# Patient Record
Sex: Female | Born: 1940 | Race: Black or African American | Hispanic: No | Smoking: Current every day smoker
Health system: Southern US, Community
[De-identification: ages and names within clinical notes are randomized; demographics above are authoritative.]

## PROBLEM LIST (undated history)

## (undated) DIAGNOSIS — I10 Essential (primary) hypertension: Secondary | ICD-10-CM

## (undated) DIAGNOSIS — H9190 Unspecified hearing loss, unspecified ear: Secondary | ICD-10-CM

## (undated) DIAGNOSIS — E039 Hypothyroidism, unspecified: Secondary | ICD-10-CM

## (undated) DIAGNOSIS — E079 Disorder of thyroid, unspecified: Secondary | ICD-10-CM

## (undated) DIAGNOSIS — K219 Gastro-esophageal reflux disease without esophagitis: Secondary | ICD-10-CM

## (undated) DIAGNOSIS — R609 Edema, unspecified: Secondary | ICD-10-CM

## (undated) HISTORY — PX: ABDOMINAL HYSTERECTOMY: SHX81

## (undated) HISTORY — PX: BACK SURGERY: SHX140

## (undated) HISTORY — PX: OTHER SURGICAL HISTORY: SHX169

---

## 2005-12-02 ENCOUNTER — Ambulatory Visit: Payer: Self-pay | Admitting: Family Medicine

## 2006-01-18 ENCOUNTER — Ambulatory Visit: Payer: Self-pay | Admitting: Unknown Physician Specialty

## 2006-04-01 ENCOUNTER — Ambulatory Visit: Payer: Self-pay | Admitting: Unknown Physician Specialty

## 2007-06-21 ENCOUNTER — Emergency Department: Payer: Self-pay | Admitting: Emergency Medicine

## 2007-06-21 ENCOUNTER — Other Ambulatory Visit: Payer: Self-pay

## 2009-10-28 ENCOUNTER — Ambulatory Visit: Payer: Self-pay

## 2009-11-17 ENCOUNTER — Ambulatory Visit: Payer: Self-pay | Admitting: Pain Medicine

## 2013-05-04 DIAGNOSIS — K219 Gastro-esophageal reflux disease without esophagitis: Secondary | ICD-10-CM | POA: Insufficient documentation

## 2015-12-29 ENCOUNTER — Emergency Department
Admission: EM | Admit: 2015-12-29 | Discharge: 2015-12-29 | Disposition: A | Discharge status: Home / Self Care | Payer: Medicare Other | Attending: Emergency Medicine | Admitting: Emergency Medicine

## 2015-12-29 ENCOUNTER — Encounter: Payer: Self-pay | Admitting: *Deleted

## 2015-12-29 DIAGNOSIS — Y9301 Activity, walking, marching and hiking: Secondary | ICD-10-CM | POA: Diagnosis not present

## 2015-12-29 DIAGNOSIS — F17200 Nicotine dependence, unspecified, uncomplicated: Secondary | ICD-10-CM | POA: Insufficient documentation

## 2015-12-29 DIAGNOSIS — I10 Essential (primary) hypertension: Secondary | ICD-10-CM | POA: Insufficient documentation

## 2015-12-29 DIAGNOSIS — X58XXXA Exposure to other specified factors, initial encounter: Secondary | ICD-10-CM | POA: Diagnosis not present

## 2015-12-29 DIAGNOSIS — Y998 Other external cause status: Secondary | ICD-10-CM | POA: Diagnosis not present

## 2015-12-29 DIAGNOSIS — S8991XA Unspecified injury of right lower leg, initial encounter: Secondary | ICD-10-CM | POA: Diagnosis present

## 2015-12-29 DIAGNOSIS — Y92000 Kitchen of unspecified non-institutional (private) residence as  the place of occurrence of the external cause: Secondary | ICD-10-CM | POA: Diagnosis not present

## 2015-12-29 DIAGNOSIS — S83241A Other tear of medial meniscus, current injury, right knee, initial encounter: Secondary | ICD-10-CM | POA: Diagnosis not present

## 2015-12-29 HISTORY — DX: Disorder of thyroid, unspecified: E07.9

## 2015-12-29 HISTORY — DX: Essential (primary) hypertension: I10

## 2015-12-29 MED ORDER — MELOXICAM 15 MG PO TABS: 15.0000 mg | ORAL_TABLET | Freq: Every day | ORAL | Status: DC

## 2015-12-29 MED ORDER — HYDROCODONE-ACETAMINOPHEN 5-325 MG PO TABS: 1.0000 | ORAL_TABLET | ORAL | Status: DC | PRN

## 2015-12-29 MED ORDER — KETOROLAC TROMETHAMINE 60 MG/2ML IM SOLN
60.0000 mg | Freq: Once | INTRAMUSCULAR | Status: AC
  Administered 2015-12-29: 60 mg via INTRAMUSCULAR
  Filled 2015-12-29: qty 2

## 2015-12-29 NOTE — Discharge Instructions (Signed)
How to Use a Knee Brace °A knee brace is a device that you wear to support your knee, especially if the knee is healing after an injury or surgery. There are several types of knee braces. Some are designed to prevent an injury (prophylactic brace). These are often worn during sports. Others support an injured knee (functional brace) or keep it still while it heals (rehabilitative brace). People with severe arthritis of the knee may benefit from a brace that takes some pressure off the knee (unloader brace). Most knee braces are made from a combination of cloth and metal or plastic.  °You may need to wear a knee brace to: °· Relieve knee pain. °· Help your knee support your weight (improve stability). °· Help you walk farther (improve mobility). °· Prevent injury. °· Support your knee while it heals from surgery or from an injury. °RISKS AND COMPLICATIONS °Generally, knee braces are very safe to wear. However, problems may occur, including: °· Skin irritation that may lead to infection. °· Making your condition worse if you wear the brace in the wrong way. °HOW TO USE A KNEE BRACE °Different braces will have different instructions for use. Your health care provider will tell you or show you: °· How to put on your brace. °· How to adjust the brace. °· When and how often to wear the brace. °· How to remove the brace. °· If you will need any assistive devices in addition to the brace, such as crutches or a cane. °In general, your brace should: °· Have the hinge of the brace line up with the bend of your knee. °· Have straps, hooks, or tapes that fasten snugly around your leg. °· Not feel too tight or too loose.  °HOW TO CARE FOR A KNEE BRACE °· Check your brace often for signs of damage, such as loose connections or attachments. Your knee brace may get damaged or wear out during normal use. °· Wash the fabric parts of your brace with soap and water. °· Read the insert that comes with your brace for other specific care  instructions.  °SEEK MEDICAL CARE IF: °· Your knee brace is too loose or too tight and you cannot adjust it. °· Your knee brace causes skin redness, swelling, bruising, or irritation. °· Your knee brace is not helping. °· Your knee brace is making your knee pain worse. °  °This information is not intended to replace advice given to you by your health care provider. Make sure you discuss any questions you have with your health care provider. °  °Document Released: 02/19/2004 Document Revised: 08/20/2015 Document Reviewed: 03/24/2015 °Elsevier Interactive Patient Education ©2016 [employer] ° °Meniscus Tear °A meniscus tear is a knee injury in which a piece of the meniscus is torn. The meniscus is a thick, rubbery, wedge-shaped cartilage in the knee. Two menisci are located in each knee. They sit between the upper bone (femur) and lower bone (tibia) that make up the knee joint. Each meniscus acts as a shock absorber for the knee. °A torn meniscus is one of the most common types of knee injuries. This injury can range from mild to severe. Surgery may be needed for a severe tear. °CAUSES °This injury may be caused by any squatting, twisting, or pivoting movement. Sports-related injuries are the most common cause. These often occur from: °· Running and stopping suddenly. °· Changing direction. °· Being tackled or knocked off your feet. °As people get older, their meniscus gets thinner and weaker. In these   people, tears can happen more easily, such as from climbing stairs.  °RISK FACTORS °This injury is more likely to happen to: °· People who play contact sports. °· Males. °· People who are 30-40 years of age. °SYMPTOMS  °Symptoms of this injury include: °· Knee pain, especially at the side of the knee joint. You may feel pain when the injury occurs, or you may only hear a pop and feel pain later. °· A feeling that your knee is clicking, catching, locking, or giving way. °· Not being able to fully bend or extend your  knee. °· Bruising or swelling in your knee. °DIAGNOSIS  °This injury may be diagnosed based on your symptoms and a physical exam. The physical exam may include: °· Moving your knee in different ways. °· Feeling for tenderness. °· Listening for a clicking sound. °· Checking if your knee locks or catches. °You may also have tests, such as: °· X-rays. °· MRI. °· A procedure to look inside your knee with a narrow surgical telescope (arthroscopy). °You may be referred to a knee specialist (orthopedic surgeon). °TREATMENT  °Treatment for this injury depends on the severity of the tear. Treatment for a mild tear may include: °· Rest. °· Medicine to reduce pain and swelling. This is usually a nonsteroidal anti-inflammatory drug (NSAID). °· A knee brace or an elastic sleeve or wrap. °· Using crutches or a walker to keep weight off your knee and to help you walk. °· Exercises to strengthen your knee (physical therapy). °You may need surgery if you have a severe tear or if other treatments are not working.  °HOME CARE INSTRUCTIONS °Managing Pain and Swelling °· Take over-the-counter and prescription medicines only as told by your health care provider. °· If directed, apply ice to the injured area: °¨ Put ice in a plastic bag. °¨ Place a towel between your skin and the bag. °¨ Leave the ice on for 20 minutes, 2-3 times per day. °· Raise (elevate) the injured area above the level of your heart while you are sitting or lying down. °Activity °· Do not use the injured limb to support your body weight until your health care provider says that you can. Use crutches or a walker as told by your health care provider. °· Return to your normal activities as told by your health care provider. Ask your health care provider what activities are safe for you. °· Perform range-of-motion exercises only as told by your health care provider. °· Begin doing exercises to strengthen your knee and leg muscles only as told by your health care provider.  After you recover, your health care provider may recommend these exercises to help prevent another injury. °General Instructions °· Use a knee brace or elastic wrap as told by your health care provider. °· Keep all follow-up visits as told by your health care provider. This is important. °SEEK MEDICAL CARE IF: °· You have a fever. °· Your knee becomes red, tender, or swollen. °· Your pain medicine is not helping. °· Your symptoms get worse or do not improve after 2 weeks of home care. °  °This information is not intended to replace advice given to you by your health care provider. Make sure you discuss any questions you have with your health care provider. °  °Document Released: 02/19/2003 Document Revised: 08/20/2015 Document Reviewed: 03/24/2015 °Elsevier Interactive Patient Education ©2016 Elsevier Inc. ° °

## 2015-12-29 NOTE — ED Notes (Signed)
Pain right knee staretd 2 weeks ago , today right knee popped

## 2015-12-29 NOTE — ED Provider Notes (Signed)
[facility] Emergency Department Provider Note  ____________________________________________  Time seen: Approximately 3:44 PM  I have reviewed the triage vital signs and the nursing notes.   HISTORY  Chief Complaint Knee Pain    HPI Patricia Nguyen is a 75 y.o. female who presents to the emergency department complaining of right knee pain. She states that she felt a "full sensation" to her right knee that started approximately 2 weeks ago. Last several wee and then resolved. She's been symptom-free for a week. This morning she states she was walking across her kitchen when she felt a "catching sensation" in her right knee. She states when she straightened it out there is a sharp pain to the medial aspect of her right knee. She endorses sharp pain to this area that has been constant. She denies any recent injury to knee. She denies any trauma to knee today. Patient states all pain is in the medial joint line.   Past Medical History  Diagnosis Date  . Thyroid disease   . Hypertension     There are no active problems to display for this patient.   History reviewed. No pertinent past surgical history.  Current Outpatient Rx  Name  Route  Sig  Dispense  Refill  . HYDROcodone-acetaminophen (NORCO/VICODIN) 5-325 MG tablet   Oral   Take 1 tablet by mouth every 4 (four) hours as needed for moderate pain.   20 tablet   0   . meloxicam (MOBIC) 15 MG tablet   Oral   Take 1 tablet (15 mg total) by mouth daily.   30 tablet   0     Allergies Review of patient's allergies indicates no known allergies.  No family history on file.  Social History Social History  Substance Use Topics  . Smoking status: Current Every Day Smoker  . Smokeless tobacco: None  . Alcohol Use: No     Review of Systems  Constitutional: No fever/chills Eyes: No visual changes. No discharge ENT: No sore throat. Cardiovascular: no chest pain. Respiratory: no cough. No  SOB. Gastrointestinal: No abdominal pain.  No nausea, no vomiting.  No diarrhea.  No constipation. Genitourinary: Negative for dysuria. No hematuria Musculoskeletal: Negative for back pain. positive for right knee pain. Skin: Negative for rash. Neurological: Negative for headaches, focal weakness or numbness. 10-point ROS otherwise negative.  ____________________________________________   PHYSICAL EXAM:  VITAL SIGNS: ED Triage Vitals  Enc Vitals Group     BP 12/29/15 1343 153/70 mmHg     Pulse Rate 12/29/15 1343 78     Resp 12/29/15 1343 20     Temp 12/29/15 1343 98.1 F (36.7 C)     Temp Source 12/29/15 1343 Oral     SpO2 12/29/15 1343 99 %     Weight 12/29/15 1343 133 lb (60.328 kg)     Height 12/29/15 1343 5\' 6"  (1.676 m)     Head Cir --      Peak Flow --      Pain Score 12/29/15 1344 10     Pain Loc --      Pain Edu? --      Excl. in GC? --      Constitutional: Alert and oriented. Well appearing and in no acute distress. Eyes: Conjunctivae are normal. PERRL. EOMI. Head: Atraumatic. ENT:      Ears:       Nose: No congestion/rhinnorhea.      Mouth/Throat: Mucous membranes are moist.  Neck: No stridor.  Hematological/Lymphatic/Immunilogical: No cervical lymphadenopathy. Cardiovascular: Normal rate, regular rhythm. Normal S1 and S2.  Good peripheral circulation. Respiratory: Normal respiratory effort without tachypnea or retractions. Lungs CTAB. Gastrointestinal: Soft and nontender. No distention. No CVA tenderness. Musculoskeletal: No lower extremity tenderness nor edema.  No joint effusions. or minor edema noted to right knee when compared to left. Limited range of motion due to pain. Patient endorses tenderness to palpation along the medial joint line. Varus, valgus, and Lachman's are negative. McMurray's is positive daily. Neurologic:  Normal speech and language. No gross focal neurologic deficits are appreciated.  Skin:  Skin is warm, dry and intact. No rash  noted. Psychiatric: Mood and affect are normal. Speech and behavior are normal. Patient exhibits appropriate insight and judgement.   ____________________________________________   LABS (all labs ordered are listed, but only abnormal results are displayed)  Labs Reviewed - No data to display ____________________________________________  EKG   ____________________________________________  RADIOLOGY   No results found.  ____________________________________________    PROCEDURES  Procedure(s) performed:       Medications  ketorolac (TORADOL) injection 60 mg (60 mg Intramuscular Given 12/29/15 1604)     ____________________________________________   INITIAL IMPRESSION / ASSESSMENT AND PLAN / ED COURSE  Pertinent labs & imaging results that were available during my care of the patient were reviewed by me and considered in my medical decision making (see chart for details).  Patient's diagnosis is consistent with  medial meniscal tear to the right knee. Patient has a positive McMurray's test and based off of this with patient's history meniscal tear is most probable cause. Patient has no history of trauma or injury so x-ray is not ordered at this time.. Patient will be discharged home with prescriptions for enteric laboratories and pain medication. Knee is immobilized prior to discharge.. Patient is to follow up with orthopedic surgeon. Patient is given ED precautions to return to the ED for any worsening or new symptoms.     ____________________________________________  FINAL CLINICAL IMPRESSION(S) / ED DIAGNOSES  Final diagnoses:  Acute medial meniscal tear, right, initial encounter      NEW MEDICATIONS STARTED DURING THIS VISIT:  New Prescriptions   HYDROCODONE-ACETAMINOPHEN (NORCO/VICODIN) 5-325 MG TABLET    Take 1 tablet by mouth every 4 (four) hours as needed for moderate pain.   MELOXICAM (MOBIC) 15 MG TABLET    Take 1 tablet (15 mg total) by mouth  daily.       Delorise RoyalsJonathan D Bonnee Zertuche, PA-C 12/29/15 1628  Jennye MoccasinBrian S Quigley, MD 12/29/15 [phone]

## 2016-01-05 DIAGNOSIS — E119 Type 2 diabetes mellitus without complications: Secondary | ICD-10-CM | POA: Insufficient documentation

## 2016-04-05 DIAGNOSIS — R195 Other fecal abnormalities: Secondary | ICD-10-CM | POA: Insufficient documentation

## 2016-04-12 ENCOUNTER — Encounter: Payer: Self-pay | Admitting: *Deleted

## 2016-04-13 ENCOUNTER — Encounter
Admission: RE | Disposition: A | Discharge status: Home / Self Care | Payer: Self-pay | Source: Ambulatory Visit | Attending: Ophthalmology

## 2016-04-13 ENCOUNTER — Ambulatory Visit: Payer: Medicare Other | Admitting: Anesthesiology

## 2016-04-13 ENCOUNTER — Encounter: Payer: Self-pay | Admitting: *Deleted

## 2016-04-13 ENCOUNTER — Ambulatory Visit
Admission: RE | Admit: 2016-04-13 | Discharge: 2016-04-13 | Disposition: A | Discharge status: Home / Self Care | Payer: Medicare Other | Source: Ambulatory Visit | Attending: Ophthalmology | Admitting: Ophthalmology

## 2016-04-13 DIAGNOSIS — E079 Disorder of thyroid, unspecified: Secondary | ICD-10-CM | POA: Insufficient documentation

## 2016-04-13 DIAGNOSIS — F17200 Nicotine dependence, unspecified, uncomplicated: Secondary | ICD-10-CM | POA: Insufficient documentation

## 2016-04-13 DIAGNOSIS — E7800 Pure hypercholesterolemia, unspecified: Secondary | ICD-10-CM | POA: Insufficient documentation

## 2016-04-13 DIAGNOSIS — I10 Essential (primary) hypertension: Secondary | ICD-10-CM | POA: Diagnosis not present

## 2016-04-13 DIAGNOSIS — Z90710 Acquired absence of both cervix and uterus: Secondary | ICD-10-CM | POA: Insufficient documentation

## 2016-04-13 DIAGNOSIS — H9190 Unspecified hearing loss, unspecified ear: Secondary | ICD-10-CM | POA: Insufficient documentation

## 2016-04-13 DIAGNOSIS — J449 Chronic obstructive pulmonary disease, unspecified: Secondary | ICD-10-CM | POA: Diagnosis not present

## 2016-04-13 DIAGNOSIS — K219 Gastro-esophageal reflux disease without esophagitis: Secondary | ICD-10-CM | POA: Diagnosis not present

## 2016-04-13 DIAGNOSIS — H2512 Age-related nuclear cataract, left eye: Secondary | ICD-10-CM | POA: Insufficient documentation

## 2016-04-13 HISTORY — PX: CATARACT EXTRACTION W/PHACO: SHX586

## 2016-04-13 HISTORY — DX: Gastro-esophageal reflux disease without esophagitis: K21.9

## 2016-04-13 HISTORY — DX: Unspecified hearing loss, unspecified ear: H91.90

## 2016-04-13 SURGERY — PHACOEMULSIFICATION, CATARACT, WITH IOL INSERTION
Anesthesia: Monitor Anesthesia Care | Site: Eye | Laterality: Left | Wound class: Clean

## 2016-04-13 MED ORDER — CARBACHOL 0.01 % IO SOLN
INTRAOCULAR | Status: DC | PRN
  Administered 2016-04-13: 0.5 mL via INTRAOCULAR

## 2016-04-13 MED ORDER — ARMC OPHTHALMIC DILATING GEL
1.0000 "application " | OPHTHALMIC | Status: DC | PRN
  Administered 2016-04-13: 1 via OPHTHALMIC

## 2016-04-13 MED ORDER — MOXIFLOXACIN HCL 0.5 % OP SOLN: 1.0000 [drp] | OPHTHALMIC | Status: DC | PRN

## 2016-04-13 MED ORDER — POVIDONE-IODINE 5 % OP SOLN
1.0000 "application " | Freq: Once | OPHTHALMIC | Status: AC
  Administered 2016-04-13: 1 via OPHTHALMIC

## 2016-04-13 MED ORDER — FENTANYL CITRATE (PF) 100 MCG/2ML IJ SOLN
INTRAMUSCULAR | Status: DC | PRN
  Administered 2016-04-13: 50 ug via INTRAVENOUS

## 2016-04-13 MED ORDER — ARMC OPHTHALMIC DILATING GEL
OPHTHALMIC | Status: AC
  Filled 2016-04-13: qty 0.25

## 2016-04-13 MED ORDER — TETRACAINE HCL 0.5 % OP SOLN
OPHTHALMIC | Status: AC
  Filled 2016-04-13: qty 2

## 2016-04-13 MED ORDER — POVIDONE-IODINE 5 % OP SOLN
OPHTHALMIC | Status: AC
  Filled 2016-04-13: qty 30

## 2016-04-13 MED ORDER — NA CHONDROIT SULF-NA HYALURON 40-17 MG/ML IO SOLN
INTRAOCULAR | Status: DC | PRN
  Administered 2016-04-13: 1 mL via INTRAOCULAR

## 2016-04-13 MED ORDER — SODIUM CHLORIDE 0.9 % IV SOLN
INTRAVENOUS | Status: DC
  Administered 2016-04-13 (×2): via INTRAVENOUS

## 2016-04-13 MED ORDER — NA CHONDROIT SULF-NA HYALURON 40-17 MG/ML IO SOLN
INTRAOCULAR | Status: AC
  Filled 2016-04-13: qty 1

## 2016-04-13 MED ORDER — EPINEPHRINE HCL 1 MG/ML IJ SOLN
INTRAOCULAR | Status: DC | PRN
  Administered 2016-04-13: 11:00:00 via OPHTHALMIC

## 2016-04-13 MED ORDER — CEFUROXIME OPHTHALMIC INJECTION 1 MG/0.1 ML
INJECTION | OPHTHALMIC | Status: AC
  Filled 2016-04-13: qty 0.1

## 2016-04-13 MED ORDER — MOXIFLOXACIN HCL 0.5 % OP SOLN
OPHTHALMIC | Status: AC
  Filled 2016-04-13: qty 3

## 2016-04-13 MED ORDER — EPINEPHRINE HCL 1 MG/ML IJ SOLN
INTRAMUSCULAR | Status: AC
  Filled 2016-04-13: qty 1

## 2016-04-13 MED ORDER — TETRACAINE HCL 0.5 % OP SOLN
1.0000 [drp] | Freq: Once | OPHTHALMIC | Status: AC
  Administered 2016-04-13: 1 [drp] via OPHTHALMIC

## 2016-04-13 MED ORDER — CEFUROXIME OPHTHALMIC INJECTION 1 MG/0.1 ML
INJECTION | OPHTHALMIC | Status: DC | PRN
  Administered 2016-04-13: 0.1 mL via INTRACAMERAL

## 2016-04-13 SURGICAL SUPPLY — 23 items
CANNULA ANT/CHMB 27GA (MISCELLANEOUS) ×3 IMPLANT
CUP MEDICINE 2OZ PLAST GRAD ST (MISCELLANEOUS) ×3 IMPLANT
GLOVE BIO SURGEON STRL SZ8 (GLOVE) ×3 IMPLANT
GLOVE BIOGEL M 6.5 STRL (GLOVE) ×3 IMPLANT
GLOVE SURG LX 8.0 MICRO (GLOVE) ×2
GLOVE SURG LX STRL 8.0 MICRO (GLOVE) ×1 IMPLANT
GOWN STRL REUS W/ TWL LRG LVL3 (GOWN DISPOSABLE) ×2 IMPLANT
GOWN STRL REUS W/TWL LRG LVL3 (GOWN DISPOSABLE) ×4
LENS IOL TECNIS 21 (Intraocular Lens) ×1 IMPLANT
LENS IOL TECNIS 21.0 (Intraocular Lens) ×2 IMPLANT
LENS IOL TECNIS MONO 1P 21.0 (Intraocular Lens) ×1 IMPLANT
PACK CATARACT (MISCELLANEOUS) ×3 IMPLANT
PACK CATARACT BRASINGTON LX (MISCELLANEOUS) ×3 IMPLANT
PACK EYE AFTER SURG (MISCELLANEOUS) ×3 IMPLANT
SOL BSS BAG (MISCELLANEOUS) ×3
SOL PREP PVP 2OZ (MISCELLANEOUS) ×3
SOLUTION BSS BAG (MISCELLANEOUS) ×1 IMPLANT
SOLUTION PREP PVP 2OZ (MISCELLANEOUS) ×1 IMPLANT
SYR 3ML LL SCALE MARK (SYRINGE) ×3 IMPLANT
SYR 5ML LL (SYRINGE) ×3 IMPLANT
SYR TB 1ML 27GX1/2 LL (SYRINGE) ×3 IMPLANT
WATER STERILE IRR 1000ML POUR (IV SOLUTION) ×3 IMPLANT
WIPE NON LINTING 3.25X3.25 (MISCELLANEOUS) ×3 IMPLANT

## 2016-04-13 NOTE — Op Note (Signed)
PREOPERATIVE DIAGNOSIS:  Nuclear sclerotic cataract of the left eye.   POSTOPERATIVE DIAGNOSIS:  Nuclear Sclerotic Cataract Left Eye   OPERATIVE PROCEDURE:  Procedure(s): CATARACT EXTRACTION PHACO AND INTRAOCULAR LENS PLACEMENT (IOC)   SURGEON:  Galen ManilaWilliam Tahmir Kleckner, MD.   ANESTHESIA:   Anesthesiologist: Rosaria FerriesJoseph K Piscitello, MD CRNA: Paulette BlanchPierre Paras, CRNA  1.      Managed anesthesia care. 2.      Topical tetracaine drops followed by 2% Xylocaine jelly applied in the preoperative holding area.   COMPLICATIONS:  None.   TECHNIQUE:   Stop and chop   DESCRIPTION OF PROCEDURE:  The patient was examined and consented in the preoperative holding area where the aforementioned topical anesthesia was applied to the left eye and then brought back to the Operating Room where the left eye was prepped and draped in the usual sterile ophthalmic fashion and a lid speculum was placed. A paracentesis was created with the side port blade and the anterior chamber was filled with viscoelastic. A near clear corneal incision was performed with the steel keratome. A continuous curvilinear capsulorrhexis was performed with a cystotome followed by the capsulorrhexis forceps. Hydrodissection and hydrodelineation were carried out with BSS on a blunt cannula. The lens was removed in a stop and chop  technique and the remaining cortical material was removed with the irrigation-aspiration handpiece. The capsular bag was inflated with viscoelastic and the Technis ZCB00 lens was placed in the capsular bag without complication. The remaining viscoelastic was removed from the eye with the irrigation-aspiration handpiece. The wounds were hydrated. The anterior chamber was flushed with Miostat and the eye was inflated to physiologic pressure. 0.1 mL of cefuroxime concentration 10 mg/mL was placed in the anterior chamber. The wounds were found to be water tight. The eye was dressed with Vigamox. The patient was given protective glasses to  wear throughout the day and a shield with which to sleep tonight. The patient was also given drops with which to begin a drop regimen today and will follow-up with me in one day.  Implant Name Type Inv. Item Serial No. Manufacturer Lot No. LRB No. Used  LENS IOL TECNIS 21.0 - [id number] Intraocular Lens LENS IOL TECNIS 21.0 [id number] AMO   Left 1   Procedure(s) with comments: CATARACT EXTRACTION PHACO AND INTRAOCULAR LENS PLACEMENT (IOC) (Left) - US 53.6 AP% 19.3 CDE 10.32 Fluid Pack Lot # [id number]  Electronically signed: Parish Augustine LOUIS 04/13/2016 10:57 AM

## 2016-04-13 NOTE — Transfer of Care (Signed)
Immediate Anesthesia Transfer of Care Note  Patient: Henrietta HooverKatie S Casselman  Procedure(s) Performed: Procedure(s) with comments: CATARACT EXTRACTION PHACO AND INTRAOCULAR LENS PLACEMENT (IOC) (Left) - US 53.6 AP% 19.3 CDE 10.32 Fluid Pack Lot # [id number]  Patient Location: PACU and Short Stay  Anesthesia Type:MAC  Level of Consciousness: awake, alert  and oriented  Airway & Oxygen Therapy: Patient Spontanous Breathing  Post-op Assessment: Report given to RN and Post -op Vital signs reviewed and stable  Post vital signs: Reviewed and stable  Last Vitals:  Filed Vitals:   04/13/16 0936  BP: 145/67  Pulse: 72  Temp: 36.5 C  Resp: 16    Last Pain: There were no vitals filed for this visit.       Complications: No apparent anesthesia complications

## 2016-04-13 NOTE — Anesthesia Postprocedure Evaluation (Signed)
Anesthesia Post Note  Patient: Patricia Nguyen  Procedure(s) Performed: Procedure(s) (LRB): CATARACT EXTRACTION PHACO AND INTRAOCULAR LENS PLACEMENT (IOC) (Left)  Patient location during evaluation: PACU Anesthesia Type: MAC Level of consciousness: awake and alert Pain management: pain level controlled Vital Signs Assessment: post-procedure vital signs reviewed and stable Respiratory status: spontaneous breathing, nonlabored ventilation, respiratory function stable and patient connected to nasal cannula oxygen Cardiovascular status: stable and blood pressure returned to baseline Anesthetic complications: no    Last Vitals:  Filed Vitals:   04/13/16 1058 04/13/16 1111  BP: 142/59 138/57  Pulse: 70 71  Temp: 36.9 C   Resp: 16 16    Last Pain: There were no vitals filed for this visit.               Cleda MccreedyJoseph K Piscitello

## 2016-04-13 NOTE — H&P (Signed)
  All labs reviewed. Abnormal studies sent to patients PCP when indicated.  Previous H&P reviewed, patient examined, there are NO CHANGES.  Santrice Muzio LOUIS5/2/201710:31 AM

## 2016-04-13 NOTE — Anesthesia Preprocedure Evaluation (Signed)
Anesthesia Evaluation  Patient identified by MRN, date of birth, ID band Patient awake    Reviewed: Allergy & Precautions, H&P , NPO status , Patient's Chart, lab work & pertinent test results  History of Anesthesia Complications Negative for: history of anesthetic complications  Airway Mallampati: III  TM Distance: >3 FB Neck ROM: limited    Dental  (+) Poor Dentition, Chipped, Missing, Upper Dentures, Lower Dentures   Pulmonary shortness of breath and with exertion, COPD, Current Smoker,    Pulmonary exam normal breath sounds clear to auscultation       Cardiovascular Exercise Tolerance: Good hypertension, (-) angina(-) Past MI and (-) DOE Normal cardiovascular exam Rhythm:regular Rate:Normal     Neuro/Psych negative neurological ROS  negative psych ROS   GI/Hepatic Neg liver ROS, GERD  Controlled,  Endo/Other  negative endocrine ROS  Renal/GU negative Renal ROS  negative genitourinary   Musculoskeletal   Abdominal   Peds  Hematology negative hematology ROS (+)   Anesthesia Other Findings Past Medical History:   Thyroid disease                                              Hypertension                                                 GERD (gastroesophageal reflux disease)                       HOH (hard of hearing)                                       Past Surgical History:   BACK SURGERY                                                  ABDOMINAL HYSTERECTOMY                                        [place]                                                            Comment:hearing implant  BMI    Body Mass Index   23.21 kg/m 2      Reproductive/Obstetrics negative OB ROS                             Anesthesia Physical Anesthesia Plan  ASA: III  Anesthesia Plan: MAC   Post-op Pain Management:    Induction:   Airway Management Planned:   Additional Equipment:   Intra-op  Plan:   Post-operative Plan:   Informed Consent: I have reviewed the patients History and Physical,  chart, labs and discussed the procedure including the risks, benefits and alternatives for the proposed anesthesia with the patient or authorized representative who has indicated his/her understanding and acceptance.   Dental Advisory Given  Plan Discussed with: Anesthesiologist, CRNA and Surgeon  Anesthesia Plan Comments:         Anesthesia Quick Evaluation

## 2016-04-13 NOTE — Discharge Instructions (Signed)
AMBULATORY SURGERY  DISCHARGE INSTRUCTIONS   1) The drugs that you were given will stay in your system until tomorrow so for the next 24 hours you should not:  A) Drive an automobile B) Make any legal decisions C) Drink any alcoholic beverage   2) You may resume regular meals tomorrow.  Today it is better to start with liquids and gradually work up to solid foods.  You may eat anything you prefer, but it is better to start with liquids, then soup and crackers, and gradually work up to solid foods.   3) Please notify your doctor immediately if you have any unusual bleeding, trouble breathing, redness and pain at the surgery site, drainage, fever, or pain not relieved by medication.    4) Additional Instructions:        Please contact your physician with any problems or Same Day Surgery at [phone], Monday through Friday 6 am to 4 pm, or [place] at [facility] Main number at [phone].        Eye Surgery Discharge Instructions  Expect mild scratchy sensation or mild soreness. DO NOT RUB YOUR EYE!  The day of surgery:  Minimal physical activity, but bed rest is not required  No reading, computer work, or close hand work  No bending, lifting, or straining.  May watch TV  For 24 hours:  No driving, legal decisions, or alcoholic beverages  Safety precautions  Eat anything you prefer: It is better to start with liquids, then soup then solid foods.  _____ Eye patch should be worn until postoperative exam tomorrow.  ____ Solar shield eyeglasses should be worn for comfort in the sunlight/patch while sleeping  Resume all regular medications including aspirin or Coumadin if these were discontinued prior to surgery. You may shower, bathe, shave, or wash your hair. Tylenol may be taken for mild discomfort.  Call your doctor if you experience significant pain, nausea, or vomiting, fever > 101 or other signs of infection. [phone] or [phone] Specific  instructions:  Follow-up Information    Follow up with PORFILIO,WILLIAM LOUIS, MD In 1 day.   Specialty:  Ophthalmology   Why:  May 3 at 9:10am   Contact information:   [address] [place] [id number] [phone]

## 2016-05-03 ENCOUNTER — Encounter: Payer: Self-pay | Admitting: Ophthalmology

## 2016-06-16 DIAGNOSIS — J439 Emphysema, unspecified: Secondary | ICD-10-CM | POA: Insufficient documentation

## 2016-06-16 DIAGNOSIS — J431 Panlobular emphysema: Secondary | ICD-10-CM | POA: Insufficient documentation

## 2016-09-07 DIAGNOSIS — F17200 Nicotine dependence, unspecified, uncomplicated: Secondary | ICD-10-CM | POA: Insufficient documentation

## 2016-10-08 DIAGNOSIS — M85859 Other specified disorders of bone density and structure, unspecified thigh: Secondary | ICD-10-CM | POA: Insufficient documentation

## 2017-02-16 DIAGNOSIS — Z122 Encounter for screening for malignant neoplasm of respiratory organs: Secondary | ICD-10-CM | POA: Insufficient documentation

## 2017-06-22 DIAGNOSIS — R7303 Prediabetes: Secondary | ICD-10-CM | POA: Insufficient documentation

## 2018-02-23 DIAGNOSIS — R918 Other nonspecific abnormal finding of lung field: Secondary | ICD-10-CM | POA: Insufficient documentation

## 2018-10-24 ENCOUNTER — Encounter: Payer: Self-pay | Admitting: *Deleted

## 2018-10-31 ENCOUNTER — Ambulatory Visit: Payer: Medicare Other | Admitting: Anesthesiology

## 2018-10-31 ENCOUNTER — Encounter
Admission: RE | Disposition: A | Discharge status: Home / Self Care | Payer: Self-pay | Source: Ambulatory Visit | Attending: Ophthalmology

## 2018-10-31 ENCOUNTER — Encounter: Payer: Self-pay | Admitting: Anesthesiology

## 2018-10-31 ENCOUNTER — Other Ambulatory Visit: Payer: Self-pay

## 2018-10-31 ENCOUNTER — Ambulatory Visit
Admission: RE | Admit: 2018-10-31 | Discharge: 2018-10-31 | Disposition: A | Discharge status: Home / Self Care | Payer: Medicare Other | Source: Ambulatory Visit | Attending: Ophthalmology | Admitting: Ophthalmology

## 2018-10-31 DIAGNOSIS — F17210 Nicotine dependence, cigarettes, uncomplicated: Secondary | ICD-10-CM | POA: Diagnosis not present

## 2018-10-31 DIAGNOSIS — I10 Essential (primary) hypertension: Secondary | ICD-10-CM | POA: Diagnosis not present

## 2018-10-31 DIAGNOSIS — H2511 Age-related nuclear cataract, right eye: Secondary | ICD-10-CM | POA: Insufficient documentation

## 2018-10-31 DIAGNOSIS — K219 Gastro-esophageal reflux disease without esophagitis: Secondary | ICD-10-CM | POA: Diagnosis not present

## 2018-10-31 DIAGNOSIS — J449 Chronic obstructive pulmonary disease, unspecified: Secondary | ICD-10-CM | POA: Diagnosis not present

## 2018-10-31 DIAGNOSIS — E7800 Pure hypercholesterolemia, unspecified: Secondary | ICD-10-CM | POA: Diagnosis not present

## 2018-10-31 HISTORY — DX: Edema, unspecified: R60.9

## 2018-10-31 HISTORY — PX: CATARACT EXTRACTION W/PHACO: SHX586

## 2018-10-31 HISTORY — DX: Hypothyroidism, unspecified: E03.9

## 2018-10-31 SURGERY — PHACOEMULSIFICATION, CATARACT, WITH IOL INSERTION
Anesthesia: Monitor Anesthesia Care | Site: Eye | Laterality: Right

## 2018-10-31 MED ORDER — POVIDONE-IODINE 5 % OP SOLN
OPHTHALMIC | Status: AC
  Filled 2018-10-31: qty 30

## 2018-10-31 MED ORDER — MOXIFLOXACIN HCL 0.5 % OP SOLN
OPHTHALMIC | Status: DC | PRN
  Administered 2018-10-31: .2 mL via OPHTHALMIC

## 2018-10-31 MED ORDER — LIDOCAINE HCL (PF) 4 % IJ SOLN
INTRAOCULAR | Status: DC | PRN
  Administered 2018-10-31: 2 mL via OPHTHALMIC

## 2018-10-31 MED ORDER — EPINEPHRINE PF 1 MG/ML IJ SOLN
INTRAMUSCULAR | Status: AC
  Filled 2018-10-31: qty 1

## 2018-10-31 MED ORDER — ARMC OPHTHALMIC DILATING DROPS
OPHTHALMIC | Status: AC
  Administered 2018-10-31: 1 via OPHTHALMIC
  Filled 2018-10-31: qty 0.5

## 2018-10-31 MED ORDER — NA CHONDROIT SULF-NA HYALURON 40-17 MG/ML IO SOLN
INTRAOCULAR | Status: DC | PRN
  Administered 2018-10-31: 1 mL via INTRAOCULAR

## 2018-10-31 MED ORDER — MOXIFLOXACIN HCL 0.5 % OP SOLN: 1.0000 [drp] | OPHTHALMIC | Status: DC | PRN

## 2018-10-31 MED ORDER — MIDAZOLAM HCL 2 MG/2ML IJ SOLN
INTRAMUSCULAR | Status: AC
  Filled 2018-10-31: qty 2

## 2018-10-31 MED ORDER — EPINEPHRINE PF 1 MG/ML IJ SOLN
INTRAOCULAR | Status: DC | PRN
  Administered 2018-10-31: 1 mL via OPHTHALMIC

## 2018-10-31 MED ORDER — ARMC OPHTHALMIC DILATING DROPS
1.0000 "application " | OPHTHALMIC | Status: AC
  Administered 2018-10-31 (×3): 1 via OPHTHALMIC

## 2018-10-31 MED ORDER — NA CHONDROIT SULF-NA HYALURON 40-17 MG/ML IO SOLN
INTRAOCULAR | Status: AC
  Filled 2018-10-31: qty 1

## 2018-10-31 MED ORDER — MOXIFLOXACIN HCL 0.5 % OP SOLN
OPHTHALMIC | Status: AC
  Filled 2018-10-31: qty 3

## 2018-10-31 MED ORDER — MIDAZOLAM HCL 2 MG/2ML IJ SOLN
INTRAMUSCULAR | Status: DC | PRN
  Administered 2018-10-31 (×2): 1 mg via INTRAVENOUS

## 2018-10-31 MED ORDER — CARBACHOL 0.01 % IO SOLN
INTRAOCULAR | Status: DC | PRN
  Administered 2018-10-31: .5 mL via INTRAOCULAR

## 2018-10-31 MED ORDER — TETRACAINE HCL 0.5 % OP SOLN
OPHTHALMIC | Status: AC
  Administered 2018-10-31: 1 [drp] via OPHTHALMIC
  Filled 2018-10-31: qty 4

## 2018-10-31 MED ORDER — LIDOCAINE HCL (PF) 4 % IJ SOLN
INTRAMUSCULAR | Status: AC
  Filled 2018-10-31: qty 5

## 2018-10-31 MED ORDER — TETRACAINE HCL 0.5 % OP SOLN
1.0000 [drp] | OPHTHALMIC | Status: AC | PRN
  Administered 2018-10-31 (×3): 1 [drp] via OPHTHALMIC

## 2018-10-31 MED ORDER — POVIDONE-IODINE 5 % OP SOLN
OPHTHALMIC | Status: DC | PRN
  Administered 2018-10-31: 1 via OPHTHALMIC

## 2018-10-31 MED ORDER — SODIUM CHLORIDE 0.9 % IV SOLN
INTRAVENOUS | Status: DC
  Administered 2018-10-31: 07:00:00 via INTRAVENOUS

## 2018-10-31 SURGICAL SUPPLY — 16 items
GLOVE BIO SURGEON STRL SZ8 (GLOVE) ×3 IMPLANT
GLOVE BIOGEL M 6.5 STRL (GLOVE) ×3 IMPLANT
GLOVE SURG LX 8.0 MICRO (GLOVE) ×2
GLOVE SURG LX STRL 8.0 MICRO (GLOVE) ×1 IMPLANT
GOWN STRL REUS W/ TWL LRG LVL3 (GOWN DISPOSABLE) ×2 IMPLANT
GOWN STRL REUS W/TWL LRG LVL3 (GOWN DISPOSABLE) ×4
LABEL CATARACT MEDS ST (LABEL) ×3 IMPLANT
LENS IOL TECNIS ITEC 22.0 (Intraocular Lens) ×3 IMPLANT
PACK CATARACT (MISCELLANEOUS) ×3 IMPLANT
PACK CATARACT BRASINGTON LX (MISCELLANEOUS) ×3 IMPLANT
PACK EYE AFTER SURG (MISCELLANEOUS) ×3 IMPLANT
SOL BSS BAG (MISCELLANEOUS) ×3
SOLUTION BSS BAG (MISCELLANEOUS) ×1 IMPLANT
SYR 5ML LL (SYRINGE) ×3 IMPLANT
WATER STERILE IRR 250ML POUR (IV SOLUTION) ×3 IMPLANT
WIPE NON LINTING 3.25X3.25 (MISCELLANEOUS) ×3 IMPLANT

## 2018-10-31 NOTE — Discharge Instructions (Addendum)
Eye Surgery Discharge Instructions    Expect mild scratchy sensation or mild soreness. DO NOT RUB YOUR EYE!  The day of surgery:  Minimal physical activity, but bed rest is not required  No reading, computer work, or close hand work  No bending, lifting, or straining.  May watch TV  For 24 hours:  No driving, legal decisions, or alcoholic beverages  Safety precautions  Eat anything you prefer: It is better to start with liquids, then soup then solid foods.   Solar shield eyeglasses should be worn for comfort in the sunlight/patch while sleeping  FOLLOW DR. PORFILIO'S EYE DROP INSTRUCTION SHEET AS REVIEWED.  Resume all regular medications including aspirin or Coumadin if these were discontinued prior to surgery. You may shower, bathe, shave, or wash your hair. Tylenol may be taken for mild discomfort.  Call your doctor if you experience significant pain, nausea, or vomiting, fever > 101 or other signs of infection. [phone] or [phone] Specific instructions:  Follow-up Information    Galen ManilaPorfilio, William, MD Follow up.   Specialty:  Ophthalmology Why:  11/01/18 at 10:55 Contact information: [address] [place] [address] [phone]        Galen ManilaPorfilio, William, MD .   Specialty:  Ophthalmology Contact information: [address] [place] [address] [phone]        [facility] .   Contact information: [address] [place] [phone]         Eye Surgery Discharge Instructions    Expect mild scratchy sensation or mild soreness. DO NOT RUB YOUR EYE!  The day of surgery:  Minimal physical activity, but bed rest is not required  No reading, computer work, or close hand work  No bending, lifting, or straining.  May watch TV  For 24 hours:  No driving, legal decisions, or alcoholic beverages  Safety precautions  Eat anything you prefer: It is better to start  with liquids, then soup then solid foods.  _____ Eye patch should be worn until postoperative exam tomorrow.  ____ Solar shield eyeglasses should be worn for comfort in the sunlight/patch while sleeping  Resume all regular medications including aspirin or Coumadin if these were discontinued prior to surgery. You may shower, bathe, shave, or wash your hair. Tylenol may be taken for mild discomfort.  Call your doctor if you experience significant pain, nausea, or vomiting, fever > 101 or other signs of infection. [phone] or [phone] Specific instructions:  Follow-up Information    Galen ManilaPorfilio, William, MD Follow up.   Specialty:  Ophthalmology Why:  11/01/18 at 10:55 Contact information: [address] [place] [address] [phone]        Galen ManilaPorfilio, William, MD .   Specialty:  Ophthalmology Contact information: [address] [place] [address] [phone]        [facility] .   Contact information: [address] [place] [phone]

## 2018-10-31 NOTE — Anesthesia Postprocedure Evaluation (Signed)
Anesthesia Post Note  Patient: Patricia Nguyen  Procedure(s) Performed: CATARACT EXTRACTION PHACO AND INTRAOCULAR LENS PLACEMENT (IOC) (Right Eye)  Patient location during evaluation: Short Stay Anesthesia Type: MAC Level of consciousness: awake and alert, oriented and patient cooperative Pain management: satisfactory to patient Vital Signs Assessment: post-procedure vital signs reviewed and stable Respiratory status: spontaneous breathing, respiratory function stable and nonlabored ventilation Cardiovascular status: blood pressure returned to baseline and stable Postop Assessment: no headache and no apparent nausea or vomiting Anesthetic complications: no     Last Vitals:  Vitals:   10/31/18 0640 10/31/18 0819  BP: (!) 158/69 (!) 139/55  Pulse: 70 67  Resp: 17 16  Temp:  (!) 35.8 C  SpO2: 100% 98%    Last Pain:  Vitals:   10/31/18 0819  TempSrc: Temporal  PainSc: 0-No pain                 Eben Burow

## 2018-10-31 NOTE — Transfer of Care (Signed)
Immediate Anesthesia Transfer of Care Note  Patient: Patricia Nguyen  Procedure(s) Performed: CATARACT EXTRACTION PHACO AND INTRAOCULAR LENS PLACEMENT (IOC) (Right Eye)  Patient Location: Short Stay  Anesthesia Type:MAC  Level of Consciousness: awake, alert , oriented and patient cooperative  Airway & Oxygen Therapy: Patient Spontanous Breathing  Post-op Assessment: Report given to RN and Post -op Vital signs reviewed and stable  Post vital signs: Reviewed and stable  Last Vitals:  Vitals Value Taken Time  BP    Temp    Pulse    Resp    SpO2      Last Pain:  Vitals:   10/31/18 0640  PainSc: 0-No pain         Complications: No apparent anesthesia complications

## 2018-10-31 NOTE — Op Note (Signed)
PREOPERATIVE DIAGNOSIS:  Nuclear sclerotic cataract of the right eye.   POSTOPERATIVE DIAGNOSIS:  nuclear sclerotic cataract right eye   OPERATIVE PROCEDURE: Procedure(s): CATARACT EXTRACTION PHACO AND INTRAOCULAR LENS PLACEMENT (IOC)   SURGEON:  Galen ManilaWilliam Lorene Samaan, MD.   ANESTHESIA:  Anesthesiologist: Piscitello, Cleda MccreedyJoseph K, MD CRNA: Dava NajjarFrazier, Susan, CRNA  1.      Managed anesthesia care. 2.      0.151ml of Shugarcaine was instilled in the eye following the paracentesis.   COMPLICATIONS:  None.   TECHNIQUE:   Stop and chop   DESCRIPTION OF PROCEDURE:  The patient was examined and consented in the preoperative holding area where the aforementioned topical anesthesia was applied to the right eye and then brought back to the Operating Room where the right eye was prepped and draped in the usual sterile ophthalmic fashion and a lid speculum was placed. A paracentesis was created with the side port blade and the anterior chamber was filled with viscoelastic. A near clear corneal incision was performed with the steel keratome. A continuous curvilinear capsulorrhexis was performed with a cystotome followed by the capsulorrhexis forceps. Hydrodissection and hydrodelineation were carried out with BSS on a blunt cannula. The lens was removed in a stop and chop  technique and the remaining cortical material was removed with the irrigation-aspiration handpiece. The capsular bag was inflated with viscoelastic and the Technis [id number]  lens was placed in the capsular bag without complication. The remaining viscoelastic was removed from the eye with the irrigation-aspiration handpiece. The wounds were hydrated. The anterior chamber was flushed with Miostat and the eye was inflated to physiologic pressure. 0.961ml of Vigamox was placed in the anterior chamber. The wounds were found to be water tight. The eye was dressed with Vigamox. The patient was given protective glasses to wear throughout the day and a shield with  which to sleep tonight. The patient was also given drops with which to begin a drop regimen today and will follow-up with me in one day. Implant Name Type Inv. Item Serial No. Manufacturer Lot No. LRB No. Used  LENS IOL DIOP 22.0 - [id number] Intraocular Lens LENS IOL DIOP 22.0 [id number] AMO  Right 1   Procedure(s) with comments: CATARACT EXTRACTION PHACO AND INTRAOCULAR LENS PLACEMENT (IOC) (Right) - US 00:37 CDE 5.23 Fluid pack lot # [id number]  Electronically signed: Galen ManilaWilliam Jezlyn Westerfield 10/31/2018 8:18 AM

## 2018-10-31 NOTE — Anesthesia Post-op Follow-up Note (Signed)
Anesthesia QCDR form completed.        

## 2018-10-31 NOTE — H&P (Signed)
All labs reviewed. Abnormal studies sent to patients PCP when indicated.  Previous H&P reviewed, patient examined, there are NO CHANGES.  Patricia Southwell Porfilio11/19/20197:54 AM

## 2018-10-31 NOTE — Anesthesia Preprocedure Evaluation (Signed)
Anesthesia Evaluation  Patient identified by MRN, date of birth, ID band Patient awake    Reviewed: Allergy & Precautions, H&P , NPO status , Patient's Chart, lab work & pertinent test results  History of Anesthesia Complications Negative for: history of anesthetic complications  Airway Mallampati: III  TM Distance: >3 FB Neck ROM: limited    Dental  (+) Poor Dentition, Chipped, Missing, Upper Dentures, Lower Dentures   Pulmonary shortness of breath and with exertion, COPD, Current Smoker,    Pulmonary exam normal breath sounds clear to auscultation       Cardiovascular Exercise Tolerance: Good hypertension, (-) angina(-) Past MI and (-) DOE Normal cardiovascular exam Rhythm:regular Rate:Normal     Neuro/Psych negative neurological ROS  negative psych ROS   GI/Hepatic Neg liver ROS, GERD  Controlled,  Endo/Other  negative endocrine ROS  Renal/GU negative Renal ROS  negative genitourinary   Musculoskeletal   Abdominal   Peds  Hematology negative hematology ROS (+)   Anesthesia Other Findings Past Medical History:   Thyroid disease                                              Hypertension                                                 GERD (gastroesophageal reflux disease)                       HOH (hard of hearing)                                       Past Surgical History:   BACK SURGERY                                                  ABDOMINAL HYSTERECTOMY                                        [place]                                                            Comment:hearing implant  BMI    Body Mass Index   23.21 kg/m 2      Reproductive/Obstetrics negative OB ROS                             Anesthesia Physical  Anesthesia Plan  ASA: III  Anesthesia Plan: MAC   Post-op Pain Management:    Induction:   PONV Risk Score and Plan:   Airway Management Planned:    Additional Equipment:   Intra-op Plan:   Post-operative Plan:   Informed Consent:  I have reviewed the patients History and Physical, chart, labs and discussed the procedure including the risks, benefits and alternatives for the proposed anesthesia with the patient or authorized representative who has indicated his/her understanding and acceptance.   Dental Advisory Given  Plan Discussed with: Anesthesiologist, CRNA and Surgeon  Anesthesia Plan Comments:         Anesthesia Quick Evaluation

## 2019-09-29 ENCOUNTER — Emergency Department: Payer: Medicare Other

## 2019-09-29 ENCOUNTER — Encounter: Payer: Self-pay | Admitting: Emergency Medicine

## 2019-09-29 ENCOUNTER — Other Ambulatory Visit: Payer: Self-pay

## 2019-09-29 ENCOUNTER — Emergency Department
Admission: EM | Admit: 2019-09-29 | Discharge: 2019-09-30 | Disposition: A | Discharge status: Home / Self Care | Payer: Medicare Other | Attending: Emergency Medicine | Admitting: Emergency Medicine

## 2019-09-29 DIAGNOSIS — I10 Essential (primary) hypertension: Secondary | ICD-10-CM | POA: Insufficient documentation

## 2019-09-29 DIAGNOSIS — Q282 Arteriovenous malformation of cerebral vessels: Secondary | ICD-10-CM | POA: Insufficient documentation

## 2019-09-29 DIAGNOSIS — Z7982 Long term (current) use of aspirin: Secondary | ICD-10-CM | POA: Insufficient documentation

## 2019-09-29 DIAGNOSIS — E039 Hypothyroidism, unspecified: Secondary | ICD-10-CM | POA: Diagnosis not present

## 2019-09-29 DIAGNOSIS — F17210 Nicotine dependence, cigarettes, uncomplicated: Secondary | ICD-10-CM | POA: Insufficient documentation

## 2019-09-29 DIAGNOSIS — H538 Other visual disturbances: Secondary | ICD-10-CM | POA: Diagnosis present

## 2019-09-29 DIAGNOSIS — R519 Headache, unspecified: Secondary | ICD-10-CM | POA: Insufficient documentation

## 2019-09-29 DIAGNOSIS — Z79899 Other long term (current) drug therapy: Secondary | ICD-10-CM | POA: Diagnosis not present

## 2019-09-29 LAB — CBC WITH DIFFERENTIAL/PLATELET
Abs Immature Granulocytes: 0.05 10*3/uL (ref 0.00–0.07)
Basophils Absolute: 0.1 10*3/uL (ref 0.0–0.1)
Basophils Relative: 0 %
Eosinophils Absolute: 0.1 10*3/uL (ref 0.0–0.5)
Eosinophils Relative: 0 %
HCT: 45.7 % (ref 36.0–46.0)
Hemoglobin: 14.9 g/dL (ref 12.0–15.0)
Immature Granulocytes: 0 %
Lymphocytes Relative: 21 %
Lymphs Abs: 2.4 10*3/uL (ref 0.7–4.0)
MCH: 29.2 pg (ref 26.0–34.0)
MCHC: 32.6 g/dL (ref 30.0–36.0)
MCV: 89.4 fL (ref 80.0–100.0)
Monocytes Absolute: 0.5 10*3/uL (ref 0.1–1.0)
Monocytes Relative: 5 %
Neutro Abs: 8.2 10*3/uL — ABNORMAL HIGH (ref 1.7–7.7)
Neutrophils Relative %: 74 %
Platelets: 186 10*3/uL (ref 150–400)
RBC: 5.11 MIL/uL (ref 3.87–5.11)
RDW: 14.7 % (ref 11.5–15.5)
WBC: 11.2 10*3/uL — ABNORMAL HIGH (ref 4.0–10.5)
nRBC: 0 % (ref 0.0–0.2)

## 2019-09-29 LAB — BASIC METABOLIC PANEL
Anion gap: 12 (ref 5–15)
BUN: 10 mg/dL (ref 8–23)
CO2: 26 mmol/L (ref 22–32)
Calcium: 10.3 mg/dL (ref 8.9–10.3)
Chloride: 100 mmol/L (ref 98–111)
Creatinine, Ser: 0.95 mg/dL (ref 0.44–1.00)
GFR calc Af Amer: >60 mL/min (ref >60)
GFR calc non Af Amer: 58 mL/min — ABNORMAL LOW (ref >60)
Glucose, Bld: 122 mg/dL — ABNORMAL HIGH (ref 70–99)
Potassium: 3.3 mmol/L — ABNORMAL LOW (ref 3.5–5.1)
Sodium: 138 mmol/L (ref 135–145)

## 2019-09-29 LAB — PROTIME-INR
INR: 1.1 (ref 0.8–1.2)
Prothrombin Time: 13.6 seconds (ref 11.4–15.2)

## 2019-09-29 MED ORDER — IOHEXOL 350 MG/ML SOLN
75.0000 mL | Freq: Once | INTRAVENOUS | Status: AC | PRN
  Administered 2019-09-29: 75 mL via INTRAVENOUS

## 2019-09-29 NOTE — ED Triage Notes (Signed)
Pt says since Tuesday she's noticed her blood pressure has been running high; pt has appt with provider Monday and he told her to check daily; pt says her pressure this am was 216/80, so she drank some vinegar; pt says tonight, when she turned her head to the right, she had a quick white flash through her vision and then it disappeared; pt says it happened again after arrival; pt turned her head in triage and it happened for a third time; pt reports pressure to the back right side of her head and neck;

## 2019-09-29 NOTE — ED Notes (Signed)
Patient taken to CT scan.

## 2019-09-29 NOTE — ED Notes (Addendum)
Blue, Red, LT green, Lavender, Grey sent to lab.

## 2019-09-29 NOTE — ED Notes (Signed)
Report given to Daniel RN

## 2019-09-30 MED ORDER — AMLODIPINE BESYLATE 5 MG PO TABS: 10.0000 mg | ORAL_TABLET | Freq: Every day | ORAL | 0 refills | Status: DC

## 2019-09-30 NOTE — ED Provider Notes (Signed)
[facility] Emergency Department Provider Note  ____________________________________________  Time seen: Approximately 12:31 AM  I have reviewed the triage vital signs and the nursing notes.   HISTORY  Chief Complaint Hypertension and visual changes    HPI Patricia Nguyen is a 78 y.o. female with a history of GERD, hypertension, hypothyroidism  who comes to the ED due to elevated blood pressure for the past 5 days.  This evening she noticed blood pressure as high as 220/80 associated with a feeling of pressure in the back of her head, blurriness of the vision of her right eye, and positional photopsia described as a white flash through her vision that occurs when she turns her head to the right.  This positional phenomenon occurred a total of 3 times.  She states that this started about an hour prior to arrival in the ED.  She denies any lateralizing weakness or paresthesias.  The feeling of pressure at the back of the head is mild and she denies any severe or sudden thunderclap headache.  No vomiting fevers chills or recent illness.  Currently blood pressure is improved and she states that she feels better and that the symptom has resolved.     Past Medical History:  Diagnosis Date  . Edema    FEET/LEGS  . GERD (gastroesophageal reflux disease)   . HOH (hard of hearing)   . Hypertension   . Hypothyroidism   . Thyroid disease      There are no active problems to display for this patient.    Past Surgical History:  Procedure Laterality Date  . ABDOMINAL HYSTERECTOMY    . BACK SURGERY    . [place]     hearing implant  . CATARACT EXTRACTION W/PHACO Left 04/13/2016   Procedure: CATARACT EXTRACTION PHACO AND INTRAOCULAR LENS PLACEMENT (IOC);  Surgeon: Galen Manila, MD;  Location: [facility] ORS;  Service: Ophthalmology;  Laterality: Left;  [place] 53.6 AP% 19.3 CDE 10.32 Fluid Pack Lot # [id number]  . CATARACT EXTRACTION W/PHACO Right 10/31/2018   Procedure:  CATARACT EXTRACTION PHACO AND INTRAOCULAR LENS PLACEMENT (IOC);  Surgeon: Galen Manila, MD;  Location: [facility] ORS;  Service: Ophthalmology;  Laterality: Right;  [place] 00:37 CDE 5.23 Fluid pack lot # [id number]     Prior to Admission medications   Medication Sig Start Date End Date Taking? Authorizing Provider  aspirin EC 81 MG tablet Take 81 mg by mouth daily.   Yes [provider]  hydrochlorothiazide (HYDRODIURIL) 25 MG tablet Take 25 mg by mouth daily.   Yes [provider]  ibuprofen (ADVIL,MOTRIN) 200 MG tablet Take 400 mg by mouth daily as needed for headache or moderate pain.   Yes [provider]  levothyroxine (SYNTHROID, LEVOTHROID) 75 MCG tablet Take 75 mcg by mouth daily before breakfast.   Yes [provider]  omeprazole (PRILOSEC) 20 MG capsule Take 20 mg by mouth daily.   Yes [provider]  pravastatin (PRAVACHOL) 20 MG tablet Take 20 mg by mouth daily.   Yes [provider]  amLODipine (NORVASC) 5 MG tablet Take 2 tablets (10 mg total) by mouth daily for 14 days. 09/30/19 10/14/19  Sharman Cheek, MD     Allergies Patient has no known allergies.   History reviewed. No pertinent family history.  Social History Social History   Tobacco Use  . Smoking status: Current Every Day Smoker    Packs/day: 0.50    Years: 60.00    Pack years: 30.00  Types: Cigarettes  . Smokeless tobacco: Never Used  Substance Use Topics  . Alcohol use: Yes    Comment: occas  . Drug use: No    Review of Systems  Constitutional:   No fever or chills.  ENT:   No sore throat. No rhinorrhea. Cardiovascular:   No chest pain or syncope. Respiratory:   No dyspnea or cough. Gastrointestinal:   Negative for abdominal pain, vomiting and diarrhea.  Musculoskeletal:   Negative for focal pain or swelling All other systems reviewed and are negative except as documented above in ROS and  HPI.  ____________________________________________   PHYSICAL EXAM:  VITAL SIGNS: ED Triage Vitals  Enc Vitals Group     BP 09/29/19 2204 (!) 177/74     Pulse Rate 09/29/19 2204 86     Resp 09/29/19 2204 17     Temp 09/29/19 2204 98.4 F (36.9 C)     Temp Source 09/29/19 2204 Oral     SpO2 09/29/19 2204 100 %     Weight 09/29/19 2205 134 lb (60.8 kg)     Height 09/29/19 2205 5\' 6"  (1.676 m)     Head Circumference --      Peak Flow --      Pain Score 09/29/19 2205 2     Pain Loc --      Pain Edu? --      Excl. in GC? --     Vital signs reviewed, nursing assessments reviewed.   Constitutional:   Alert and oriented. Non-toxic appearance. Eyes:   Conjunctivae are normal. EOMI. PERRLA. ENT      Head:   Normocephalic and atraumatic.      Nose:   Wearing a mask.      Mouth/Throat:   Wearing a mask.      Neck:   No meningismus. Full ROM. Hematological/Lymphatic/Immunilogical:   No cervical lymphadenopathy. Cardiovascular:   RRR. Symmetric bilateral radial and DP pulses.  No murmurs. Cap refill less than 2 seconds. Respiratory:   Normal respiratory effort without tachypnea/retractions. Breath sounds are clear and equal bilaterally. No wheezes/rales/rhonchi. Gastrointestinal:   Soft and nontender. Non distended. There is no CVA tenderness.  No rebound, rigidity, or guarding. Genitourinary:   deferred Musculoskeletal:   Normal range of motion in all extremities. No joint effusions.  No lower extremity tenderness.  No edema. Neurologic:   Normal speech and language.  Motor grossly intact. Normal finger-to-nose.  No pronator drift Normal visual fields No acute focal neurologic deficits are appreciated.  NIH stroke scale 0 Skin:    Skin is warm, dry and intact. No rash noted.  No petechiae, purpura, or bullae.  ____________________________________________    LABS (pertinent positives/negatives) (all labs ordered are listed, but only abnormal results are displayed) Labs  Reviewed  BASIC METABOLIC PANEL - Abnormal; Notable for the following components:      Result Value   Potassium 3.3 (*)    Glucose, Bld 122 (*)    GFR calc non Af Amer 58 (*)    All other components within normal limits  CBC WITH DIFFERENTIAL/PLATELET - Abnormal; Notable for the following components:   WBC 11.2 (*)    Neutro Abs 8.2 (*)    All other components within normal limits  PROTIME-INR   ____________________________________________   EKG    ____________________________________________    RADIOLOGY  Ct Angio Head W Or Wo Contrast  Result Date: 09/30/2019 CLINICAL DATA:  Initial evaluation for acute intermittent visual changes. EXAM: CT ANGIOGRAPHY HEAD AND  NECK TECHNIQUE: Multidetector CT imaging of the head and neck was performed using the standard protocol during bolus administration of intravenous contrast. Multiplanar CT image reconstructions and MIPs were obtained to evaluate the vascular anatomy. Carotid stenosis measurements (when applicable) are obtained utilizing NASCET criteria, using the distal internal carotid diameter as the denominator. CONTRAST:  75mL OMNIPAQUE IOHEXOL 350 MG/ML SOLN COMPARISON:  Prior head CT from earlier the same day. FINDINGS: CTA NECK FINDINGS Aortic arch: Visualized aortic arch normal caliber with normal 3 vessel morphology. Moderate atherosclerotic change seen about the visualized arch and origin of the great vessels. Associated short-segment stenosis of up to 50% at the proximal left subclavian artery (series 6, image 47). No other hemodynamically significant stenosis about the origin of the great vessels. Visualized subclavian arteries otherwise widely patent. Right carotid system: Right CCA tortuous but widely patent from its origin to the bifurcation. Mixed atheromatous plaque about the right bifurcation/proximal right ICA with associated stenosis of up to 65% by NASCET criteria. Right ICA otherwise widely patent distally to the skull base  without additional stenosis, dissection or occlusion. Left carotid system: Left CCA patent from its origin to the bifurcation without hemodynamically significant stenosis. Concentric soft plaque about the left bifurcation/proximal left ICA with associated stenosis of up to 40% by NASCET criteria. Left ICA tortuous but otherwise widely patent to the skull base without stenosis, dissection, or occlusion. Vertebral arteries: Both vertebral arteries arise from the subclavian arteries. Atheromatous plaque at the origin of the left vertebral artery with associated stenosis of up to 70%. Left vertebral artery otherwise widely patent within the neck. Scattered atheromatous plaque within the right V1 and proximal right V2 segments with associated mild to moderate multifocal narrowing (up to approximately 50%). Right vertebral otherwise widely patent within the neck. Skeleton: No acute osseous abnormality. No discrete lytic or blastic osseous lesions. Moderate cervical spondylosis at C4-5 through C7-T1. Prominent degenerative changes about the C1-2 articulation as well. Patient is edentulous. Other neck: No other acute soft tissue abnormality within the neck. No mass lesion or adenopathy. Upper chest: Visualized upper chest demonstrates no acute finding. Moderate centrilobular emphysematous changes noted within the visualized lungs. 5 mm left upper lobe nodule noted (series 4, image 5). Additional 4 mm nodule present at the right lung apex (series 4, image 40). Review of the MIP images confirms the above findings CTA HEAD FINDINGS Anterior circulation: Petrous segments patent bilaterally without significant stenosis. Scattered atheromatous plaque within the cavernous/supraclinoid ICAs with associated moderate multifocal narrowing (up to approximately 50-70% on the left, up to approximate 50% on the right. ICA termini well perfused. 2-3 mm focal outpouching arising at the takeoff of the right posterior communicating artery  favored to reflect a small vascular infundibulum. A1 segments patent bilaterally. Right A1 slightly dominant. Normal anterior communicating artery. Anterior cerebral arteries widely patent to their distal aspects without stenosis. No M1 stenosis or occlusion. Negative MCA bifurcations. Distal MCA branches well perfused and symmetric. Posterior circulation: Right V4 segment widely patent to the vertebrobasilar junction. Mild scattered plaque within the left V4 segment with associated mild multifocal stenosis. Left vertebral otherwise widely patent. There is an abnormal tangle of vessels seen at the level of the medulla, suggestive of a focal AVM or possibly dural AVF (series 6, images 210-234).Difficult to be certain of the exact arterial vascular supply of these vessels, which could arise from either vertebral artery or possibly the posteroinferior cerebral arteries. Prominence of the anterior spinal artery noted inferiorly Short fenestration of the  proximal basilar artery noted. Basilar widely patent to its distal aspect without flow-limiting stenosis. Superior cerebral arteries patent bilaterally. Both of the posterior cerebral arteries primarily supplied via the basilar and are well perfused to their distal aspects. Venous sinuses: Patent. Anatomic variants: None significant. Review of the MIP images confirms the above findings IMPRESSION: 1. Negative CTA for emergent large vessel occlusion or other acute vascular abnormality. 2. Abnormal collection/tangle of vessels at the level of the medulla as above, suggesting a possible dural AVF or possibly AVM. Follow-up examination with dedicated catheter directed arteriogram could be performed for further evaluation as clinically warranted. 3. Short-segment 65% atheromatous stenosis about the right bifurcation/proximal right ICA. 4. Short-segment 40% atheromatous stenosis about the left bifurcation/proximal left ICA. 5. Approximate 70% stenosis at the origin of the left  vertebral artery. 6. Short-segment 50% stenosis about the proximal left subclavian artery. 7.  Emphysema (ICD10-J43.9). 8. Few scattered 4-5 mm pulmonary nodules as above, indeterminate. No follow-up needed if patient is low-risk (and has no known or suspected primary neoplasm). Non-contrast chest CT can be considered in 12 months if patient is high-risk. This recommendation follows the consensus statement: Guidelines for Management of Incidental Pulmonary Nodules Detected on CT Images: From the Fleischner Society 2017; Radiology 2017; 284:228-243. Electronically Signed   By: Jeannine Boga M.D.   On: 09/30/2019 00:49   Ct Head Wo Contrast  Result Date: 09/29/2019 CLINICAL DATA:  78 year old female with hypertension. Visual changes today. EXAM: CT HEAD WITHOUT CONTRAST TECHNIQUE: Contiguous axial images were obtained from the base of the skull through the vertex without intravenous contrast. COMPARISON:  Head CT dated 06/21/2007 FINDINGS: Brain: The ventricles and sulci appropriate size for patient's age. Minimal periventricular and deep white matter chronic microvascular ischemic changes noted. There is no acute intracranial hemorrhage. No mass effect or midline shift. No extra-axial fluid collection. Vascular: No hyperdense vessel or unexpected calcification. Skull: Normal. Negative for fracture or focal lesion. Sinuses/Orbits: No acute finding. Other: None. IMPRESSION: No acute intracranial pathology. Electronically Signed   By: Anner Crete M.D.   On: 09/29/2019 23:01   Ct Angio Neck W And/or Wo Contrast  Result Date: 09/30/2019 CLINICAL DATA:  Initial evaluation for acute intermittent visual changes. EXAM: CT ANGIOGRAPHY HEAD AND NECK TECHNIQUE: Multidetector CT imaging of the head and neck was performed using the standard protocol during bolus administration of intravenous contrast. Multiplanar CT image reconstructions and MIPs were obtained to evaluate the vascular anatomy. Carotid  stenosis measurements (when applicable) are obtained utilizing NASCET criteria, using the distal internal carotid diameter as the denominator. CONTRAST:  61mL OMNIPAQUE IOHEXOL 350 MG/ML SOLN COMPARISON:  Prior head CT from earlier the same day. FINDINGS: CTA NECK FINDINGS Aortic arch: Visualized aortic arch normal caliber with normal 3 vessel morphology. Moderate atherosclerotic change seen about the visualized arch and origin of the great vessels. Associated short-segment stenosis of up to 50% at the proximal left subclavian artery (series 6, image 47). No other hemodynamically significant stenosis about the origin of the great vessels. Visualized subclavian arteries otherwise widely patent. Right carotid system: Right CCA tortuous but widely patent from its origin to the bifurcation. Mixed atheromatous plaque about the right bifurcation/proximal right ICA with associated stenosis of up to 65% by NASCET criteria. Right ICA otherwise widely patent distally to the skull base without additional stenosis, dissection or occlusion. Left carotid system: Left CCA patent from its origin to the bifurcation without hemodynamically significant stenosis. Concentric soft plaque about the left bifurcation/proximal left ICA with  associated stenosis of up to 40% by NASCET criteria. Left ICA tortuous but otherwise widely patent to the skull base without stenosis, dissection, or occlusion. Vertebral arteries: Both vertebral arteries arise from the subclavian arteries. Atheromatous plaque at the origin of the left vertebral artery with associated stenosis of up to 70%. Left vertebral artery otherwise widely patent within the neck. Scattered atheromatous plaque within the right V1 and proximal right V2 segments with associated mild to moderate multifocal narrowing (up to approximately 50%). Right vertebral otherwise widely patent within the neck. Skeleton: No acute osseous abnormality. No discrete lytic or blastic osseous lesions.  Moderate cervical spondylosis at C4-5 through C7-T1. Prominent degenerative changes about the C1-2 articulation as well. Patient is edentulous. Other neck: No other acute soft tissue abnormality within the neck. No mass lesion or adenopathy. Upper chest: Visualized upper chest demonstrates no acute finding. Moderate centrilobular emphysematous changes noted within the visualized lungs. 5 mm left upper lobe nodule noted (series 4, image 5). Additional 4 mm nodule present at the right lung apex (series 4, image 40). Review of the MIP images confirms the above findings CTA HEAD FINDINGS Anterior circulation: Petrous segments patent bilaterally without significant stenosis. Scattered atheromatous plaque within the cavernous/supraclinoid ICAs with associated moderate multifocal narrowing (up to approximately 50-70% on the left, up to approximate 50% on the right. ICA termini well perfused. 2-3 mm focal outpouching arising at the takeoff of the right posterior communicating artery favored to reflect a small vascular infundibulum. A1 segments patent bilaterally. Right A1 slightly dominant. Normal anterior communicating artery. Anterior cerebral arteries widely patent to their distal aspects without stenosis. No M1 stenosis or occlusion. Negative MCA bifurcations. Distal MCA branches well perfused and symmetric. Posterior circulation: Right V4 segment widely patent to the vertebrobasilar junction. Mild scattered plaque within the left V4 segment with associated mild multifocal stenosis. Left vertebral otherwise widely patent. There is an abnormal tangle of vessels seen at the level of the medulla, suggestive of a focal AVM or possibly dural AVF (series 6, images 210-234).Difficult to be certain of the exact arterial vascular supply of these vessels, which could arise from either vertebral artery or possibly the posteroinferior cerebral arteries. Prominence of the anterior spinal artery noted inferiorly Short fenestration of  the proximal basilar artery noted. Basilar widely patent to its distal aspect without flow-limiting stenosis. Superior cerebral arteries patent bilaterally. Both of the posterior cerebral arteries primarily supplied via the basilar and are well perfused to their distal aspects. Venous sinuses: Patent. Anatomic variants: None significant. Review of the MIP images confirms the above findings IMPRESSION: 1. Negative CTA for emergent large vessel occlusion or other acute vascular abnormality. 2. Abnormal collection/tangle of vessels at the level of the medulla as above, suggesting a possible dural AVF or possibly AVM. Follow-up examination with dedicated catheter directed arteriogram could be performed for further evaluation as clinically warranted. 3. Short-segment 65% atheromatous stenosis about the right bifurcation/proximal right ICA. 4. Short-segment 40% atheromatous stenosis about the left bifurcation/proximal left ICA. 5. Approximate 70% stenosis at the origin of the left vertebral artery. 6. Short-segment 50% stenosis about the proximal left subclavian artery. 7.  Emphysema (ICD10-J43.9). 8. Few scattered 4-5 mm pulmonary nodules as above, indeterminate. No follow-up needed if patient is low-risk (and has no known or suspected primary neoplasm). Non-contrast chest CT can be considered in 12 months if patient is high-risk. This recommendation follows the consensus statement: Guidelines for Management of Incidental Pulmonary Nodules Detected on CT Images: From the Fleischner Society 2017;  Radiology 2017; [id number]. Electronically Signed   By: Rise Mu M.D.   On: 09/30/2019 00:49    ____________________________________________   PROCEDURES Procedures  ____________________________________________  DIFFERENTIAL DIAGNOSIS   Intracranial hemorrhage, cerebral aneurysm, vertebral/basilar/carotid dissection or occlusion  CLINICAL IMPRESSION / ASSESSMENT AND PLAN / ED COURSE  Medications  ordered in the ED: Medications  iohexol (OMNIPAQUE) 350 MG/ML injection 75 mL (75 mLs Intravenous Contrast Given 09/29/19 2347)    Pertinent labs & imaging results that were available during my care of the patient were reviewed by me and considered in my medical decision making (see chart for details).  MONQUE HAGGAR was evaluated in Emergency Department on 09/30/2019 for the symptoms described in the history of present illness. She was evaluated in the context of the global COVID-19 pandemic, which necessitated consideration that the patient might be at risk for infection with the SARS-CoV-2 virus that causes COVID-19. Institutional protocols and algorithms that pertain to the evaluation of patients at risk for COVID-19 are in a state of rapid change based on information released by regulatory bodies including the CDC and federal and state organizations. These policies and algorithms were followed during the patient's care in the ED.   Patient presents with severe hypertension as well as mild occipital headache associated with right-sided visual disturbance aggravated by turning the head to the right.  Suspicious for vascular lesion such as occlusion or dissection.  Initial noncontrast CT head is negative.  Will obtain a CT angiogram of the head and neck to further evaluate.  With blood pressure control her symptoms seem to have resolved, so if CTA is unremarkable, will plan to titrate her Norvasc up to 10 mg daily in advance of seeing her doctor.  Clinical Course as of Sep 30 139  Wynelle Link Sep 30, 2019  0124 CTA negative for acute occlusive pathology, stable for follow-up.  Will refer to neurology for evaluation of chronic vascular issues.   [PS]    Clinical Course User Index [PS] Sharman Cheek, MD     ____________________________________________   FINAL CLINICAL IMPRESSION(S) / ED DIAGNOSES    Final diagnoses:  Essential hypertension  AVM (arteriovenous malformation) brain     ED  Discharge Orders         Ordered    amLODipine (NORVASC) 5 MG tablet  Daily     09/30/19 0138          Portions of this note were generated with dragon dictation software. Dictation errors may occur despite best attempts at proofreading.   Sharman Cheek, MD 09/30/19 [phone]

## 2019-09-30 NOTE — Discharge Instructions (Signed)
Your CT scan did not show any acute vascular issues to explain your symptoms, but there is some chronic vascular disease that you should follow up with your doctor to evaluate further.  You can also see Neurology for additional advice.

## 2019-10-11 DIAGNOSIS — Q2730 Arteriovenous malformation, site unspecified: Secondary | ICD-10-CM | POA: Insufficient documentation

## 2019-10-11 DIAGNOSIS — I6523 Occlusion and stenosis of bilateral carotid arteries: Secondary | ICD-10-CM | POA: Insufficient documentation

## 2019-10-19 ENCOUNTER — Other Ambulatory Visit: Payer: Self-pay

## 2019-10-19 ENCOUNTER — Encounter (INDEPENDENT_AMBULATORY_CARE_PROVIDER_SITE_OTHER): Payer: Self-pay | Admitting: Vascular Surgery

## 2019-10-19 ENCOUNTER — Ambulatory Visit (INDEPENDENT_AMBULATORY_CARE_PROVIDER_SITE_OTHER): Payer: Medicare Other | Admitting: Vascular Surgery

## 2019-10-19 VITALS — BP 168/71 | HR 86 | Resp 16 | Ht 65.0 in | Wt 134.0 lb

## 2019-10-19 DIAGNOSIS — I6523 Occlusion and stenosis of bilateral carotid arteries: Secondary | ICD-10-CM

## 2019-10-19 DIAGNOSIS — E119 Type 2 diabetes mellitus without complications: Secondary | ICD-10-CM | POA: Diagnosis not present

## 2019-10-19 DIAGNOSIS — E785 Hyperlipidemia, unspecified: Secondary | ICD-10-CM

## 2019-10-19 DIAGNOSIS — Q2730 Arteriovenous malformation, site unspecified: Secondary | ICD-10-CM | POA: Diagnosis not present

## 2019-10-19 DIAGNOSIS — I10 Essential (primary) hypertension: Secondary | ICD-10-CM

## 2019-10-19 NOTE — Assessment & Plan Note (Signed)
Intracranially.  This would also be better assessed with an angiogram.  She and her daughter are determining whether or not they want to have the angiogram in the near future.

## 2019-10-19 NOTE — Assessment & Plan Note (Signed)
blood pressure control important in reducing the progression of atherosclerotic disease. On appropriate oral medications.  

## 2019-10-19 NOTE — Progress Notes (Signed)
Patient ID: Patricia HooverKatie S Nguyen, female   DOB: 10/22/1941, 78 y.o.   MRN: [id number]  Chief Complaint  Patient presents with   New Patient (Initial Visit)    f/u carotid stenosis    HPI Patricia HooverKatie S Labrosse is a 78 y.o. female.  I am asked to see the patient by Dr. Malvin JohnsPotter for evaluation of carotid disease.  The patient reports about 2 to 3 weeks ago going to the emergency room for a severe headache.  She was found to be severely hypertensive with blood pressures in excess of 220 systolic.  Since that time, she has had some occasional headaches but nothing severe.  Her blood pressure today is still elevated but not nearly that elevated.  She did not have focal arm or leg weakness or numbness.  There is no speech or swallow difficulty.  No temporary monocular blindness.  She underwent a CT angiogram which I have independently reviewed which had a large number of vascular findings.  There was an approximately 65% right carotid artery stenosis and an approximately 40% left carotid artery stenosis.  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  Also, there appeared to be an AV malformation although it appeared fairly small intracranially.  Given these findings, she is referred for further evaluation and treatment   Past Medical History:  Diagnosis Date   Edema    FEET/LEGS   GERD (gastroesophageal reflux disease)    HOH (hard of hearing)    Hypertension    Hypothyroidism    Thyroid disease     Past Surgical History:  Procedure Laterality Date   ABDOMINAL HYSTERECTOMY     BACK SURGERY     [place]     hearing implant   CATARACT EXTRACTION W/PHACO Left 04/13/2016   Procedure: CATARACT EXTRACTION PHACO AND INTRAOCULAR LENS PLACEMENT (IOC);  Surgeon: Galen ManilaWilliam Porfilio, MD;  Location: [facility] ORS;  Service: Ophthalmology;  Laterality: Left;  US 53.6 AP% 19.3 CDE 10.32 Fluid Pack Lot # [id number]     CATARACT EXTRACTION W/PHACO Right 10/31/2018   Procedure: CATARACT EXTRACTION PHACO AND INTRAOCULAR LENS PLACEMENT (IOC);  Surgeon: Galen ManilaPorfilio, William, MD;  Location: [facility] ORS;  Service: Ophthalmology;  Laterality: Right;  US 00:37 CDE 5.23 Fluid pack lot # [id number]    Family History No history of bleeding disorders, clotting disorders, vascular malformations, aneurysms, or porphyria   Social History   Tobacco Use   Smoking status: Current Every Day Smoker    Packs/day: 0.50    Years: 60.00    Pack years: 30.00    Types: Cigarettes   Smokeless tobacco: Never Used  Substance Use Topics   Alcohol use: Yes    Comment: occas   Drug use: No     Allergies  Allergen Reactions   Lisinopril Other (See Comments)    copugh copugh     Current Outpatient Medications  Medication Sig Dispense Refill   aspirin EC 81 MG tablet Take 81 mg by mouth daily.     hydrochlorothiazide (HYDRODIURIL) 25 MG tablet Take 25 mg by mouth daily.     ibuprofen (ADVIL,MOTRIN) 200 MG tablet Take 400 mg by mouth daily as needed for headache or moderate pain.     levothyroxine (SYNTHROID, LEVOTHROID) 75 MCG tablet Take 75 mcg by mouth daily before breakfast.     omeprazole (PRILOSEC) 20 MG capsule Take 20 mg by mouth daily.  pravastatin (PRAVACHOL) 20 MG tablet Take 20 mg by mouth daily.     amLODipine (NORVASC) 5 MG tablet Take 2 tablets (10 mg total) by mouth daily for 14 days. 28 tablet 0   No current facility-administered medications for this visit.       REVIEW OF SYSTEMS (Negative unless checked)  Constitutional: Weight loss  Fever  Chills Cardiac: Chest pain   Chest pressure   Palpitations   Shortness of breath when laying flat   Shortness of breath at rest   Shortness of breath with exertion. Vascular:  Pain in legs with walking   Pain in legs at rest   Pain in legs when laying flat   Claudication   Pain in feet when walking  Pain in feet at rest   Pain in feet when laying flat   History of DVT   Phlebitis   Swelling in legs   Varicose veins   Non-healing ulcers Pulmonary:   Uses home oxygen   Productive cough   Hemoptysis   Wheeze  COPD   Asthma Neurologic:  Dizziness  Blackouts   Seizures   History of stroke   History of TIA  Aphasia   Temporary blindness   Dysphagia   Weakness or numbness in arms   Weakness or numbness in legs X positive for headaches Musculoskeletal:  Arthritis   Joint swelling   Joint pain   Low back pain Hematologic:  Easy bruising  Easy bleeding   Hypercoagulable state   Anemic  Hepatitis Gastrointestinal:  Blood in stool   Vomiting blood  Gastroesophageal reflux/heartburn   Abdominal pain Genitourinary:  Chronic kidney disease   Difficult urination  Frequent urination  Burning with urination   Hematuria Skin:  Rashes   Ulcers   Wounds Psychological:  History of anxiety    History of major depression.    Physical Exam BP (!) 168/71 (BP Location: Right Arm)    Pulse 86    Resp 16    Ht  (1.651 m)    Wt 134 lb (60.8 kg)    BMI 22.30 kg/m  Gen:  WD/WN, NAD Head: [place]/AT, No temporalis wasting. Ear/Nose/Throat: Hearing grossly intact, nares w/o erythema or drainage, oropharynx w/o Erythema/Exudate Eyes: Conjunctiva clear, sclera non-icteric  Neck: trachea midline.  Left carotid bruit Pulmonary:  Good air movement, clear to auscultation bilaterally.  Cardiac: RRR, normal S1, S2 Vascular:  Vessel Right Left  Radial Palpable  1+ palpable                                   Gastrointestinal: soft, non-tender/non-distended.  Musculoskeletal: M/S 5/5 throughout.  Extremities without ischemic changes.  No deformity or atrophy. Trace LE edema. Neurologic: Sensation grossly intact in extremities.  Symmetrical.  Speech is fluent. Motor exam as listed above. Psychiatric: Judgment intact, Mood & affect appropriate for pt's  clinical situation. Dermatologic: No rashes or ulcers noted.  No cellulitis or open wounds.  Radiology Ct Angio Head W Or Wo Contrast  Result Date: 09/30/2019 CLINICAL DATA:  Initial evaluation for acute intermittent visual changes. EXAM: CT ANGIOGRAPHY HEAD AND NECK TECHNIQUE: Multidetector CT imaging of the head and neck was performed using the standard protocol during bolus administration of intravenous contrast. Multiplanar CT image reconstructions and MIPs were obtained to evaluate the vascular anatomy. Carotid stenosis measurements (when applicable) are obtained utilizing NASCET criteria, using the distal internal carotid diameter as the denominator.  CONTRAST:  28mL OMNIPAQUE IOHEXOL 350 MG/ML SOLN COMPARISON:  Prior head CT from earlier the same day. FINDINGS: CTA NECK FINDINGS Aortic arch: Visualized aortic arch normal caliber with normal 3 vessel morphology. Moderate atherosclerotic change seen about the visualized arch and origin of the great vessels. Associated short-segment stenosis of up to 50% at the proximal left subclavian artery (series 6, image 47). No other hemodynamically significant stenosis about the origin of the great vessels. Visualized subclavian arteries otherwise widely patent. Right carotid system: Right CCA tortuous but widely patent from its origin to the bifurcation. Mixed atheromatous plaque about the right bifurcation/proximal right ICA with associated stenosis of up to 65% by NASCET criteria. Right ICA otherwise widely patent distally to the skull base without additional stenosis, dissection or occlusion. Left carotid system: Left CCA patent from its origin to the bifurcation without hemodynamically significant stenosis. Concentric soft plaque about the left bifurcation/proximal left ICA with associated stenosis of up to 40% by NASCET criteria. Left ICA tortuous but otherwise widely patent to the skull base without stenosis, dissection, or occlusion. Vertebral arteries: Both  vertebral arteries arise from the subclavian arteries. Atheromatous plaque at the origin of the left vertebral artery with associated stenosis of up to 70%. Left vertebral artery otherwise widely patent within the neck. Scattered atheromatous plaque within the right V1 and proximal right V2 segments with associated mild to moderate multifocal narrowing (up to approximately 50%). Right vertebral otherwise widely patent within the neck. Skeleton: No acute osseous abnormality. No discrete lytic or blastic osseous lesions. Moderate cervical spondylosis at C4-5 through C7-T1. Prominent degenerative changes about the C1-2 articulation as well. Patient is edentulous. Other neck: No other acute soft tissue abnormality within the neck. No mass lesion or adenopathy. Upper chest: Visualized upper chest demonstrates no acute finding. Moderate centrilobular emphysematous changes noted within the visualized lungs. 5 mm left upper lobe nodule noted (series 4, image 5). Additional 4 mm nodule present at the right lung apex (series 4, image 40). Review of the MIP images confirms the above findings CTA HEAD FINDINGS Anterior circulation: Petrous segments patent bilaterally without significant stenosis. Scattered atheromatous plaque within the cavernous/supraclinoid ICAs with associated moderate multifocal narrowing (up to approximately 50-70% on the left, up to approximate 50% on the right. ICA termini well perfused. 2-3 mm focal outpouching arising at the takeoff of the right posterior communicating artery favored to reflect a small vascular infundibulum. A1 segments patent bilaterally. Right A1 slightly dominant. Normal anterior communicating artery. Anterior cerebral arteries widely patent to their distal aspects without stenosis. No M1 stenosis or occlusion. Negative MCA bifurcations. Distal MCA branches well perfused and symmetric. Posterior circulation: Right V4 segment widely patent to the vertebrobasilar junction. Mild  scattered plaque within the left V4 segment with associated mild multifocal stenosis. Left vertebral otherwise widely patent. There is an abnormal tangle of vessels seen at the level of the medulla, suggestive of a focal AVM or possibly dural AVF (series 6, images 210-234).Difficult to be certain of the exact arterial vascular supply of these vessels, which could arise from either vertebral artery or possibly the posteroinferior cerebral arteries. Prominence of the anterior spinal artery noted inferiorly Short fenestration of the proximal basilar artery noted. Basilar widely patent to its distal aspect without flow-limiting stenosis. Superior cerebral arteries patent bilaterally. Both of the posterior cerebral arteries primarily supplied via the basilar and are well perfused to their distal aspects. Venous sinuses: Patent. Anatomic variants: None significant. Review of the MIP images confirms the above findings  IMPRESSION: 1. Negative CTA for emergent large vessel occlusion or other acute vascular abnormality. 2. Abnormal collection/tangle of vessels at the level of the medulla as above, suggesting a possible dural AVF or possibly AVM. Follow-up examination with dedicated catheter directed arteriogram could be performed for further evaluation as clinically warranted. 3. Short-segment 65% atheromatous stenosis about the right bifurcation/proximal right ICA. 4. Short-segment 40% atheromatous stenosis about the left bifurcation/proximal left ICA. 5. Approximate 70% stenosis at the origin of the left vertebral artery. 6. Short-segment 50% stenosis about the proximal left subclavian artery. 7.  Emphysema (ICD10-J43.9). 8. Few scattered 4-5 mm pulmonary nodules as above, indeterminate. No follow-up needed if patient is low-risk (and has no known or suspected primary neoplasm). Non-contrast chest CT can be considered in 12 months if patient is high-risk. This recommendation follows the consensus statement: Guidelines for  Management of Incidental Pulmonary Nodules Detected on CT Images: From the Fleischner Society 2017; Radiology 2017; 284:228-243. Electronically Signed   By: Rise Mu M.D.   On: 09/30/2019 00:49   Ct Head Wo Contrast  Result Date: 09/29/2019 CLINICAL DATA:  78 year old female with hypertension. Visual changes today. EXAM: CT HEAD WITHOUT CONTRAST TECHNIQUE: Contiguous axial images were obtained from the base of the skull through the vertex without intravenous contrast. COMPARISON:  Head CT dated 06/21/2007 FINDINGS: Brain: The ventricles and sulci appropriate size for patient's age. Minimal periventricular and deep white matter chronic microvascular ischemic changes noted. There is no acute intracranial hemorrhage. No mass effect or midline shift. No extra-axial fluid collection. Vascular: No hyperdense vessel or unexpected calcification. Skull: Normal. Negative for fracture or focal lesion. Sinuses/Orbits: No acute finding. Other: None. IMPRESSION: No acute intracranial pathology. Electronically Signed   By: Elgie Collard M.D.   On: 09/29/2019 23:01   Ct Angio Neck W And/or Wo Contrast  Result Date: 09/30/2019 CLINICAL DATA:  Initial evaluation for acute intermittent visual changes. EXAM: CT ANGIOGRAPHY HEAD AND NECK TECHNIQUE: Multidetector CT imaging of the head and neck was performed using the standard protocol during bolus administration of intravenous contrast. Multiplanar CT image reconstructions and MIPs were obtained to evaluate the vascular anatomy. Carotid stenosis measurements (when applicable) are obtained utilizing NASCET criteria, using the distal internal carotid diameter as the denominator. CONTRAST:  75mL OMNIPAQUE IOHEXOL 350 MG/ML SOLN COMPARISON:  Prior head CT from earlier the same day. FINDINGS: CTA NECK FINDINGS Aortic arch: Visualized aortic arch normal caliber with normal 3 vessel morphology. Moderate atherosclerotic change seen about the visualized arch and origin  of the great vessels. Associated short-segment stenosis of up to 50% at the proximal left subclavian artery (series 6, image 47). No other hemodynamically significant stenosis about the origin of the great vessels. Visualized subclavian arteries otherwise widely patent. Right carotid system: Right CCA tortuous but widely patent from its origin to the bifurcation. Mixed atheromatous plaque about the right bifurcation/proximal right ICA with associated stenosis of up to 65% by NASCET criteria. Right ICA otherwise widely patent distally to the skull base without additional stenosis, dissection or occlusion. Left carotid system: Left CCA patent from its origin to the bifurcation without hemodynamically significant stenosis. Concentric soft plaque about the left bifurcation/proximal left ICA with associated stenosis of up to 40% by NASCET criteria. Left ICA tortuous but otherwise widely patent to the skull base without stenosis, dissection, or occlusion. Vertebral arteries: Both vertebral arteries arise from the subclavian arteries. Atheromatous plaque at the origin of the left vertebral artery with associated stenosis of up to 70%. Left vertebral  artery otherwise widely patent within the neck. Scattered atheromatous plaque within the right V1 and proximal right V2 segments with associated mild to moderate multifocal narrowing (up to approximately 50%). Right vertebral otherwise widely patent within the neck. Skeleton: No acute osseous abnormality. No discrete lytic or blastic osseous lesions. Moderate cervical spondylosis at C4-5 through C7-T1. Prominent degenerative changes about the C1-2 articulation as well. Patient is edentulous. Other neck: No other acute soft tissue abnormality within the neck. No mass lesion or adenopathy. Upper chest: Visualized upper chest demonstrates no acute finding. Moderate centrilobular emphysematous changes noted within the visualized lungs. 5 mm left upper lobe nodule noted (series 4,  image 5). Additional 4 mm nodule present at the right lung apex (series 4, image 40). Review of the MIP images confirms the above findings CTA HEAD FINDINGS Anterior circulation: Petrous segments patent bilaterally without significant stenosis. Scattered atheromatous plaque within the cavernous/supraclinoid ICAs with associated moderate multifocal narrowing (up to approximately 50-70% on the left, up to approximate 50% on the right. ICA termini well perfused. 2-3 mm focal outpouching arising at the takeoff of the right posterior communicating artery favored to reflect a small vascular infundibulum. A1 segments patent bilaterally. Right A1 slightly dominant. Normal anterior communicating artery. Anterior cerebral arteries widely patent to their distal aspects without stenosis. No M1 stenosis or occlusion. Negative MCA bifurcations. Distal MCA branches well perfused and symmetric. Posterior circulation: Right V4 segment widely patent to the vertebrobasilar junction. Mild scattered plaque within the left V4 segment with associated mild multifocal stenosis. Left vertebral otherwise widely patent. There is an abnormal tangle of vessels seen at the level of the medulla, suggestive of a focal AVM or possibly dural AVF (series 6, images 210-234).Difficult to be certain of the exact arterial vascular supply of these vessels, which could arise from either vertebral artery or possibly the posteroinferior cerebral arteries. Prominence of the anterior spinal artery noted inferiorly Short fenestration of the proximal basilar artery noted. Basilar widely patent to its distal aspect without flow-limiting stenosis. Superior cerebral arteries patent bilaterally. Both of the posterior cerebral arteries primarily supplied via the basilar and are well perfused to their distal aspects. Venous sinuses: Patent. Anatomic variants: None significant. Review of the MIP images confirms the above findings IMPRESSION: 1. Negative CTA for emergent  large vessel occlusion or other acute vascular abnormality. 2. Abnormal collection/tangle of vessels at the level of the medulla as above, suggesting a possible dural AVF or possibly AVM. Follow-up examination with dedicated catheter directed arteriogram could be performed for further evaluation as clinically warranted. 3. Short-segment 65% atheromatous stenosis about the right bifurcation/proximal right ICA. 4. Short-segment 40% atheromatous stenosis about the left bifurcation/proximal left ICA. 5. Approximate 70% stenosis at the origin of the left vertebral artery. 6. Short-segment 50% stenosis about the proximal left subclavian artery. 7.  Emphysema (ICD10-J43.9). 8. Few scattered 4-5 mm pulmonary nodules as above, indeterminate. No follow-up needed if patient is low-risk (and has no known or suspected primary neoplasm). Non-contrast chest CT can be considered in 12 months if patient is high-risk. This recommendation follows the consensus statement: Guidelines for Management of Incidental Pulmonary Nodules Detected on CT Images: From the Fleischner Society 2017; Radiology 2017; 284:228-243. Electronically Signed   By: Jeannine Boga M.D.   On: 09/30/2019 00:49    Labs Recent Results (from the past 2160 hour(s))  Basic metabolic panel     Status: Abnormal   Collection Time: 09/29/19 11:08 PM  Result Value Ref Range   Sodium 138  135 - 145 mmol/L   Potassium 3.3 (L) 3.5 - 5.1 mmol/L   Chloride 100 98 - 111 mmol/L   CO2 26 22 - 32 mmol/L   Glucose, Bld 122 (H) 70 - 99 mg/dL   BUN 10 8 - 23 mg/dL   Creatinine, Ser 9.14 0.44 - 1.00 mg/dL   Calcium 78.2 8.9 - 95.6 mg/dL   GFR calc non Af Amer 58 (L) >60 mL/min   GFR calc Af Amer >60 >60 mL/min   Anion gap 12 5 - 15    Comment: Performed at [facility], [address] [place], [place] [address]  CBC with Differential     Status: Abnormal   Collection Time: 09/29/19 11:08 PM  Result Value Ref Range   WBC 11.2 (H) 4.0 - 10.5 K/uL    RBC 5.11 3.87 - 5.11 MIL/uL   Hemoglobin 14.9 12.0 - 15.0 g/dL   HCT 65.7 84.6 - 96.2 %   MCV 89.4 80.0 - 100.0 fL   MCH 29.2 26.0 - 34.0 pg   MCHC 32.6 30.0 - 36.0 g/dL   RDW 95.2 84.1 - 32.4 %   Platelets 186 150 - 400 K/uL   nRBC 0.0 0.0 - 0.2 %   Neutrophils Relative % 74 %   Neutro Abs 8.2 (H) 1.7 - 7.7 K/uL   Lymphocytes Relative 21 %   Lymphs Abs 2.4 0.7 - 4.0 K/uL   Monocytes Relative 5 %   Monocytes Absolute 0.5 0.1 - 1.0 K/uL   Eosinophils Relative 0 %   Eosinophils Absolute 0.1 0.0 - 0.5 K/uL   Basophils Relative 0 %   Basophils Absolute 0.1 0.0 - 0.1 K/uL   Immature Granulocytes 0 %   Abs Immature Granulocytes 0.05 0.00 - 0.07 K/uL    Comment: Performed at [facility], [address] [place], [place] [address]  Protime-INR     Status: None   Collection Time: 09/29/19 11:08 PM  Result Value Ref Range   Prothrombin Time 13.6 11.4 - 15.2 seconds   INR 1.1 0.8 - 1.2    Comment: (NOTE) INR goal varies based on device and disease states. Performed at [facility], [address] [place], [place] [address]     Assessment/Plan:  Type 2 diabetes mellitus (HCC) blood glucose control important in reducing the progression of atherosclerotic disease. Also, involved in wound healing. On appropriate medications.   Essential hypertension blood pressure control important in reducing the progression of atherosclerotic disease. On appropriate oral medications.   Hyperlipidemia lipid control important in reducing the progression of atherosclerotic disease. Continue statin therapy   AVM (arteriovenous malformation) Intracranially.  This would also be better assessed with an angiogram.  She and her daughter are determining whether or not they want to have the angiogram in the near future.  Carotid stenosis, asymptomatic, bilateral CT angiogram which I have independently reviewed which had a large number of vascular findings.  There was an approximately  65% right carotid artery stenosis and an approximately 40% left carotid artery stenosis.  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  Also, there appeared to be an AV malformation although it appeared fairly small intracranially.  Given all of these findings, although nothing appears to be in the high-grade or surgical range currently, there are some borderline lesions.  Catheter-based angiogram may help delineate the lesions in more  detail and determine if they are severe enough to warrant surgery particularly in a reasonably functional and otherwise fairly healthy patient.  Angiogram would also give [place] a better picture of the AV malformation potentially as well.  I discussed that if this is significant in size or growing that would be a neurosurgical issue and not a vascular surgery issue.  An alternative would be a short interval follow-up of 2 to 3 months with a duplex.  She and her daughter are determining whether or not they want to have the angiogram and will contact our office if they do.  Otherwise I will see her back in 2 to 3 months.      Festus Barren 10/19/2019, 10:44 AM   This note was created with Dragon medical transcription system.  Any errors from dictation are unintentional.

## 2019-10-19 NOTE — Assessment & Plan Note (Signed)
lipid control important in reducing the progression of atherosclerotic disease. Continue statin therapy  

## 2019-10-19 NOTE — Assessment & Plan Note (Signed)
blood glucose control important in reducing the progression of atherosclerotic disease. Also, involved in wound healing. On appropriate medications.  

## 2019-10-19 NOTE — Assessment & Plan Note (Signed)
CT angiogram which I have independently reviewed which had a large number of vascular findings.  There was an approximately 65% right carotid artery stenosis and an approximately 40% left carotid artery stenosis.  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  Also, there appeared to be an AV malformation although it appeared fairly small intracranially.  Given all of these findings, although nothing appears to be in the high-grade or surgical range currently, there are some borderline lesions.  Catheter-based angiogram may help delineate the lesions in more detail and determine if they are severe enough to warrant surgery particularly in a reasonably functional and otherwise fairly healthy patient.  Angiogram would also give [place] a better picture of the AV malformation potentially as well.  I discussed that if this is significant in size or growing that would be a neurosurgical issue and not a vascular surgery issue.  An alternative would be a short interval follow-up of 2 to 3 months with a duplex.  She and her daughter are determining whether or not they want to have the angiogram and will contact our office if they do.  Otherwise I will see her back in 2 to 3 months.

## 2019-10-19 NOTE — Patient Instructions (Signed)
Carotid Artery Disease  The carotid arteries are arteries on both sides of the neck. They carry blood to the brain, face, and neck. Carotid artery disease happens when these arteries become smaller (narrow) or get blocked. If these arteries become smaller or get blocked, you are more likely to have a stroke or a warning stroke (transient ischemic attack). Follow these instructions at home:  Take over-the-counter and prescription medicines only as told by your doctor.  Make sure you understand all instructions about your medicines. Do not stop taking your medicines without talking to your doctor first.  Follow your doctor's diet instructions. It is important to follow a healthy diet. ? Eat foods that include plenty of: ? Fresh fruits. ? Vegetables. ? Lean meats. ? Avoid these foods: ? Foods that are high in fat. ? Foods that are high in salt (sodium). ? Foods that are fried. ? Foods that are processed. ? Foods that have few good nutrients (poor nutritional value).  Keep a healthy weight.  Stay active. Get at least 30 minutes of activity every day.  Do not smoke.  Limit alcohol use to: ? No more than 2 drinks a day for men. ? No more than 1 drink a day for women who are not pregnant.  Do not use illegal drugs.  Keep all follow-up visits as told by your doctor. This is important. Contact a doctor if: Get help right away if:  You have any symptoms of stroke or TIA. The acronym BEFAST is an easy way to remember the main warning signs of stroke. ? B = Balance problems. Signs include dizziness, sudden trouble walking, or loss of balance ? E = Eye problems. This includes trouble seeing or a sudden change in vision. ? F = Face changes. This includes sudden weakness or numbness of the face, or the face or eyelid drooping to one side. ? A = Arm weakness or numbness. This happens suddenly and usually on one side of the body. ? S = Speech problems. This includes trouble speaking or  trouble understanding. ? T = Time. Time to call 911 or seek emergency care. Do not wait to see if symptoms go away. Make note of the time your symptoms started.  Other signs of stroke may include: ? A sudden, severe headache with no known cause. ? Feeling sick to your stomach (nauseous) or throwing up (vomiting). ? Seizure. Call your local emergency services (911 in [place] Do notdrive yourself to the clinic or hospital. Summary  The carotid arteries are arteries on both sides of the neck.  If these arteries get smaller or get blocked, you are more likely to have a stroke or a warning stroke (transient ischemic attack).  Take over-the-counter and prescription medicines only as told by your doctor.  Keep all follow-up visits as told by your doctor. This is important. This information is not intended to replace advice given to you by your health care provider. Make sure you discuss any questions you have with your health care provider. Document Released: 11/15/2012 Document Revised: 11/24/2017 Document Reviewed: 11/24/2017 Elsevier Patient Education  2020 [employer]  

## 2019-12-21 ENCOUNTER — Encounter (INDEPENDENT_AMBULATORY_CARE_PROVIDER_SITE_OTHER): Payer: Medicare Other

## 2019-12-21 ENCOUNTER — Ambulatory Visit (INDEPENDENT_AMBULATORY_CARE_PROVIDER_SITE_OTHER): Payer: Medicare Other | Admitting: Nurse Practitioner

## 2020-01-09 ENCOUNTER — Ambulatory Visit (INDEPENDENT_AMBULATORY_CARE_PROVIDER_SITE_OTHER): Payer: Medicare Other | Admitting: Nurse Practitioner

## 2020-01-09 ENCOUNTER — Encounter (INDEPENDENT_AMBULATORY_CARE_PROVIDER_SITE_OTHER): Payer: Self-pay | Admitting: Nurse Practitioner

## 2020-01-09 ENCOUNTER — Ambulatory Visit (INDEPENDENT_AMBULATORY_CARE_PROVIDER_SITE_OTHER): Payer: Medicare Other

## 2020-01-09 ENCOUNTER — Encounter (INDEPENDENT_AMBULATORY_CARE_PROVIDER_SITE_OTHER): Payer: Self-pay

## 2020-01-09 ENCOUNTER — Other Ambulatory Visit: Payer: Self-pay

## 2020-01-09 VITALS — BP 146/63 | HR 80 | Resp 16 | Ht 64.0 in | Wt 129.0 lb

## 2020-01-09 DIAGNOSIS — I6523 Occlusion and stenosis of bilateral carotid arteries: Secondary | ICD-10-CM

## 2020-01-09 DIAGNOSIS — Q2730 Arteriovenous malformation, site unspecified: Secondary | ICD-10-CM | POA: Diagnosis not present

## 2020-01-09 DIAGNOSIS — F17200 Nicotine dependence, unspecified, uncomplicated: Secondary | ICD-10-CM

## 2020-01-11 ENCOUNTER — Encounter (INDEPENDENT_AMBULATORY_CARE_PROVIDER_SITE_OTHER): Payer: Self-pay | Admitting: Nurse Practitioner

## 2020-01-11 NOTE — Progress Notes (Signed)
SUBJECTIVE:  Patient ID: Patricia Nguyen, female    DOB: September 26, 1941, 79 y.o.   MRN: [id number] Chief Complaint  Patient presents with  . Follow-up    HPI  Patricia Nguyen is a 79 y.o. female  The patient is seen for follow up evaluation of carotid stenosis. The carotid stenosis followed by ultrasound.   The patient denies amaurosis fugax. There is no recent history of TIA symptoms or focal motor deficits. There is no prior documented CVA.  The patient is taking enteric-coated aspirin 81 mg daily.  There is no history of migraine headaches. There is no history of seizures.  The patient has a history of coronary artery disease, no recent episodes of angina or shortness of breath. The patient denies PAD or claudication symptoms. There is a history of hyperlipidemia which is being treated with a statin.    Duplex ultrasound shows 40-59% RCEA, 1-39% LCEA stenosis.    Past Medical History:  Diagnosis Date  . Edema    FEET/LEGS  . GERD (gastroesophageal reflux disease)   . HOH (hard of hearing)   . Hypertension   . Hypothyroidism   . Thyroid disease     Past Surgical History:  Procedure Laterality Date  . ABDOMINAL HYSTERECTOMY    . BACK SURGERY    . [place]     hearing implant  . CATARACT EXTRACTION W/PHACO Left 04/13/2016   Procedure: CATARACT EXTRACTION PHACO AND INTRAOCULAR LENS PLACEMENT (IOC);  Surgeon: Birder Robson, MD;  Location: [facility] ORS;  Service: Ophthalmology;  Laterality: Left;  [place] 53.6 AP% 19.3 CDE 10.32 Fluid Pack Lot # [id number]  . CATARACT EXTRACTION W/PHACO Right 10/31/2018   Procedure: CATARACT EXTRACTION PHACO AND INTRAOCULAR LENS PLACEMENT ([place]);  Surgeon: Birder Robson, MD;  Location: [facility] ORS;  Service: Ophthalmology;  Laterality: Right;  [place] 00:37 CDE 5.23 Fluid pack lot # [id number]    Social History   Socioeconomic History  . Marital status: Divorced    Spouse name: Not on file  . Number of children: Not on file  . Years of education: Not on  file  . Highest education level: Not on file  Occupational History  . Not on file  Tobacco Use  . Smoking status: Current Every Day Smoker    Packs/day: 0.50    Years: 60.00    Pack years: 30.00    Types: Cigarettes  . Smokeless tobacco: Never Used  Substance and Sexual Activity  . Alcohol use: Yes    Comment: occas  . Drug use: No  . Sexual activity: Not on file  Other Topics Concern  . Not on file  Social History Narrative  . Not on file   Social Determinants of Health   Financial Resource Strain:   . Difficulty of Paying Living Expenses: Not on file  Food Insecurity:   . Worried About Charity fundraiser in the Last Year: Not on file  . Ran Out of Food in the Last Year: Not on file  Transportation Needs:   . Lack of Transportation (Medical): Not on file  . Lack of Transportation (Non-Medical): Not on file  Physical Activity:   . Days of Exercise per Week: Not on file  . Minutes of Exercise per Session: Not on file  Stress:   . Feeling of Stress : Not on file  Social Connections:   . Frequency of Communication with Friends and Family: Not on file  . Frequency of Social Gatherings with Friends and Family: Not on file  .  Attends Religious Services: Not on file  . Active Member of Clubs or Organizations: Not on file  . Attends Archivist Meetings: Not on file  . Marital Status: Not on file  Intimate Partner Violence:   . Fear of Current or Ex-Partner: Not on file  . Emotionally Abused: Not on file  . Physically Abused: Not on file  . Sexually Abused: Not on file    Family History  Problem Relation Age of Onset  . Heart disease Mother   . Stroke Father   . Diabetes Sister   . Parkinson's disease Sister     Allergies  Allergen Reactions  . Lisinopril Other (See Comments)    copugh copugh  cough     Review of Systems   Review of Systems: Negative Unless Checked Constitutional: [] Weight loss  [] Fever  [] Chills Cardiac: [] Chest pain   []   Atrial Fibrillation  [] Palpitations   [] Shortness of breath when laying flat   [] Shortness of breath with exertion. [] Shortness of breath at rest Vascular:  [] Pain in legs with walking   [] Pain in legs with standing [] Pain in legs when laying flat   [] Claudication    [] Pain in feet when laying flat    [] History of DVT   [] Phlebitis   [] Swelling in legs   [] Varicose veins   [] Non-healing ulcers Pulmonary:   [] Uses home oxygen   [] Productive cough   [] Hemoptysis   [] Wheeze  [] COPD   [] Asthma Neurologic:  [] Dizziness   [] Seizures  [] Blackouts [] History of stroke   [] History of TIA  [] Aphasia   [] Temporary Blindness   [] Weakness or numbness in arm   [] Weakness or numbness in leg Musculoskeletal:   [] Joint swelling   [] Joint pain   [] Low back pain  []  History of Knee Replacement [] Arthritis [] back Surgeries  []  Spinal Stenosis    Hematologic:  [] Easy bruising  [] Easy bleeding   [] Hypercoagulable state   [] Anemic Gastrointestinal:  [] Diarrhea   [] Vomiting  [x] Gastroesophageal reflux/heartburn   [] Difficulty swallowing. [] Abdominal pain Genitourinary:  [] Chronic kidney disease   [] Difficult urination  [] Anuric   [] Blood in urine [] Frequent urination  [] Burning with urination   [] Hematuria Skin:  [] Rashes   [] Ulcers [] Wounds Psychological:  [] History of anxiety   []  History of major depression  []  Memory Difficulties      OBJECTIVE:   Physical Exam  BP (!) 146/63 (BP Location: Right Arm)   Pulse 80   Resp 16   Ht 5' 4"  (1.626 m)   Wt 129 lb (58.5 kg)   BMI 22.14 kg/m   Gen: WD/WN, NAD Head: [place]/AT, No temporalis wasting.  Ear/Nose/Throat: Hearing grossly intact, nares w/o erythema or drainage Eyes: PER, EOMI, sclera nonicteric.  Neck: Supple, no masses.  No JVD.  Pulmonary:  Good air movement, no use of accessory muscles.  Cardiac: RRR Vascular: R carotid bruit Vessel Right Left  Radial Palpable Palpable   Gastrointestinal: soft, non-distended. No guarding/no peritoneal signs.    Musculoskeletal: M/S 5/5 throughout.  No deformity or atrophy.  Neurologic: Pain and light touch intact in extremities.  Symmetrical.  Speech is fluent. Motor exam as listed above. Psychiatric: Judgment intact, Mood & affect appropriate for pt's clinical situation. Dermatologic: No Venous rashes. No Ulcers Noted.  No changes consistent with cellulitis. Lymph : No Cervical lymphadenopathy, no lichenification or skin changes of chronic lymphedema.       ASSESSMENT AND PLAN:  1. Carotid stenosis, asymptomatic, bilateral Recommend:  Given the patient's asymptomatic subcritical stenosis no further  invasive testing or surgery at this time.  Duplex ultrasound shows 40-59% RCEA, 1-39% LCEA stenosis.  Continue antiplatelet therapy as prescribed Continue management of CAD, HTN and Hyperlipidemia Healthy heart diet,  encouraged exercise at least 4 times per week Follow up in 6 months with duplex ultrasound and physical exam   2. AVM (arteriovenous malformation) At this time, patient does not wish to proceed with angiogram.  Patient feels that she doesn't want to fix AVM no matter what stage it is at.  Will continue to consider it.   3. Tobacco use disorder Smoking cessation was discussed, 3-10 minutes spent on this topic specifically   Current Outpatient Medications on File Prior to Visit  Medication Sig Dispense Refill  . amLODipine (NORVASC) 5 MG tablet Take 2 tablets (10 mg total) by mouth daily for 14 days. 28 tablet 0  . aspirin EC 81 MG tablet Take 81 mg by mouth daily.    . Blood Glucose Monitoring Suppl (FIFTY50 GLUCOSE METER 2.0) w/Device KIT Use as directed    . Cholecalciferol (D3-50 PO) Take 50 mcg by mouth daily.    . Lancets 28G MISC Use 1 each once daily Use as instructed.    Marland Kitchen levothyroxine (SYNTHROID, LEVOTHROID) 75 MCG tablet Take 75 mcg by mouth daily before breakfast.    . metFORMIN (GLUCOPHAGE) 500 MG tablet Take by mouth.    Marland Kitchen omeprazole (PRILOSEC) 20 MG capsule  Take 20 mg by mouth daily.    . pravastatin (PRAVACHOL) 20 MG tablet Take 20 mg by mouth daily.    . valsartan (DIOVAN) 80 MG tablet Take 80 mg by mouth daily.    . hydrochlorothiazide (HYDRODIURIL) 25 MG tablet Take 25 mg by mouth daily.    Marland Kitchen ibuprofen (ADVIL,MOTRIN) 200 MG tablet Take 400 mg by mouth daily as needed for headache or moderate pain.     No current facility-administered medications on file prior to visit.    There are no Patient Instructions on file for this visit. No follow-ups on file.   Kris Hartmann, NP  This note was completed with Sales executive.  Any errors are purely unintentional.

## 2020-07-08 ENCOUNTER — Other Ambulatory Visit: Payer: Self-pay

## 2020-07-08 ENCOUNTER — Other Ambulatory Visit (INDEPENDENT_AMBULATORY_CARE_PROVIDER_SITE_OTHER): Payer: Self-pay | Admitting: Nurse Practitioner

## 2020-07-08 ENCOUNTER — Ambulatory Visit (INDEPENDENT_AMBULATORY_CARE_PROVIDER_SITE_OTHER): Payer: Medicare Other

## 2020-07-08 ENCOUNTER — Encounter (INDEPENDENT_AMBULATORY_CARE_PROVIDER_SITE_OTHER): Payer: Self-pay | Admitting: Nurse Practitioner

## 2020-07-08 ENCOUNTER — Ambulatory Visit (INDEPENDENT_AMBULATORY_CARE_PROVIDER_SITE_OTHER): Payer: Medicare Other | Admitting: Nurse Practitioner

## 2020-07-08 VITALS — BP 150/69 | HR 75 | Resp 17 | Ht 66.0 in | Wt 128.0 lb

## 2020-07-08 DIAGNOSIS — I6523 Occlusion and stenosis of bilateral carotid arteries: Secondary | ICD-10-CM

## 2020-07-08 DIAGNOSIS — I739 Peripheral vascular disease, unspecified: Secondary | ICD-10-CM | POA: Diagnosis not present

## 2020-07-08 DIAGNOSIS — F17200 Nicotine dependence, unspecified, uncomplicated: Secondary | ICD-10-CM | POA: Diagnosis not present

## 2020-07-08 DIAGNOSIS — I10 Essential (primary) hypertension: Secondary | ICD-10-CM

## 2020-07-09 ENCOUNTER — Encounter (INDEPENDENT_AMBULATORY_CARE_PROVIDER_SITE_OTHER): Payer: Self-pay | Admitting: Nurse Practitioner

## 2020-07-09 NOTE — Progress Notes (Signed)
Subjective:    Patient ID: Patricia Nguyen, female    DOB: 1941-12-03, 79 y.o.   MRN: [id number] Chief Complaint  Patient presents with  . Follow-up    ultrasound    The patient is seen for follow up evaluation of carotid stenosis. The carotid stenosis followed by ultrasound.   The patient denies amaurosis fugax. There is no recent history of TIA symptoms or focal motor deficits. There is no prior documented CVA.  The patient is taking enteric-coated aspirin 81 mg daily.  There is no history of migraine headaches. There is no history of seizures.  The patient has a history of coronary artery disease, no recent episodes of angina or shortness of breath.  There is a history of hyperlipidemia which is being treated with a statin.    Carotid Duplex done today shows 40 to 59% stenosis of the right ICA with 1 to 39% stenosis of the left ICA.  However, the patient also has complaints of pain with ambulation.  The patient notes that she has a cramping sensation whenever she walks any extended distance.  This is worse with inclines.  She denies any fever, chills, nausea, vomiting or diarrhea.  The patient is a longtime smoker and also has a history of hypertension, hyperlipidemia.  This increases the likelihood for possible peripheral arterial disease, addition to her known carotid artery stenosis   Review of Systems  Cardiovascular:       Claudication  All other systems reviewed and are negative.      Objective:   Physical Exam Vitals reviewed.  HENT:     Head: Normocephalic.  Cardiovascular:     Rate and Rhythm: Normal rate and regular rhythm.     Pulses: Decreased pulses.     Heart sounds: Normal heart sounds.  Pulmonary:     Effort: Pulmonary effort is normal.     Breath sounds: Normal breath sounds.  Musculoskeletal:        General: Normal range of motion.  Skin:    General: Skin is warm and dry.  Neurological:     Mental Status: She is alert and oriented to person,  place, and time.  Psychiatric:        Mood and Affect: Mood normal.        Behavior: Behavior normal.        Thought Content: Thought content normal.        Judgment: Judgment normal.     BP (!) 150/69 (BP Location: Right Arm)   Pulse 75   Resp 17   Ht 5' 6"  (1.676 m)   Wt 128 lb (58.1 kg)   BMI 20.66 kg/m   Past Medical History:  Diagnosis Date  . Edema    FEET/LEGS  . GERD (gastroesophageal reflux disease)   . HOH (hard of hearing)   . Hypertension   . Hypothyroidism   . Thyroid disease     Social History   Socioeconomic History  . Marital status: Divorced    Spouse name: Not on file  . Number of children: Not on file  . Years of education: Not on file  . Highest education level: Not on file  Occupational History  . Not on file  Tobacco Use  . Smoking status: Current Every Day Smoker    Packs/day: 0.50    Years: 60.00    Pack years: 30.00    Types: Cigarettes  . Smokeless tobacco: Never Used  Substance and Sexual Activity  . Alcohol use:  Yes    Comment: occas  . Drug use: No  . Sexual activity: Not on file  Other Topics Concern  . Not on file  Social History Narrative  . Not on file   Social Determinants of Health   Financial Resource Strain:   . Difficulty of Paying Living Expenses:   Food Insecurity:   . Worried About Charity fundraiser in the Last Year:   . Arboriculturist in the Last Year:   Transportation Needs:   . Film/video editor (Medical):   Marland Kitchen Lack of Transportation (Non-Medical):   Physical Activity:   . Days of Exercise per Week:   . Minutes of Exercise per Session:   Stress:   . Feeling of Stress :   Social Connections:   . Frequency of Communication with Friends and Family:   . Frequency of Social Gatherings with Friends and Family:   . Attends Religious Services:   . Active Member of Clubs or Organizations:   . Attends Archivist Meetings:   Marland Kitchen Marital Status:   Intimate Partner Violence:   . Fear of Current  or Ex-Partner:   . Emotionally Abused:   Marland Kitchen Physically Abused:   . Sexually Abused:     Past Surgical History:  Procedure Laterality Date  . ABDOMINAL HYSTERECTOMY    . BACK SURGERY    . [place]     hearing implant  . CATARACT EXTRACTION W/PHACO Left 04/13/2016   Procedure: CATARACT EXTRACTION PHACO AND INTRAOCULAR LENS PLACEMENT ([facility]);  Surgeon: Birder Robson, MD;  Location: [facility] ORS;  Service: Ophthalmology;  Laterality: Left;  [place] 53.6 AP% 19.3 CDE 10.32 Fluid Pack Lot # [id number]  . CATARACT EXTRACTION W/PHACO Right 10/31/2018   Procedure: CATARACT EXTRACTION PHACO AND INTRAOCULAR LENS PLACEMENT ([place]);  Surgeon: Birder Robson, MD;  Location: [facility] ORS;  Service: Ophthalmology;  Laterality: Right;  [place] 00:37 CDE 5.23 Fluid pack lot # [id number]    Family History  Problem Relation Age of Onset  . Heart disease Mother   . Stroke Father   . Diabetes Sister   . Parkinson's disease Sister     Allergies  Allergen Reactions  . Lisinopril Other (See Comments)    copugh copugh  cough       Assessment & Plan:   1. Carotid stenosis, asymptomatic, bilateral Recommend:  Given the patient's asymptomatic subcritical stenosis no further invasive testing or surgery at this time.  Duplex ultrasound shows 40 to 59% stenosis of the right ICA with 1 to 39% stenosis of the left.  Continue antiplatelet therapy as prescribed Continue management of CAD, HTN and Hyperlipidemia Healthy heart diet,  encouraged exercise at least 4 times per week Follow up in 12 months with duplex ultrasound and physical exam   2. Tobacco use disorder Smoking cessation was discussed, 3-10 minutes spent on this topic specifically   3. Essential hypertension Continue antihypertensive medications as already ordered, these medications have been reviewed and there are no changes at this time.   4. Claudication [facility]) Given the patient's description of pain, in addition to her risk factors, atherosclerotic  disease is a definite possibility for her lower extremity pain.  We will have the patient return at her convenience with noninvasive studies.  Once the patient returns we will discuss treatment options versus whether she needs to be referred to a different  specialist.  Otherwise patient is advised to continue with antiplatelet therapy. - VAS [place] ABI WITH/WO TBI; Future   Current  Outpatient Medications on File Prior to Visit  Medication Sig Dispense Refill  . aspirin EC 81 MG tablet Take 81 mg by mouth daily.    . Blood Glucose Monitoring Suppl (FIFTY50 GLUCOSE METER 2.0) w/Device KIT Use as directed    . Cholecalciferol (D3-50 PO) Take 50 mcg by mouth daily.    . hydrochlorothiazide (HYDRODIURIL) 25 MG tablet Take 25 mg by mouth daily.    Marland Kitchen ibuprofen (ADVIL,MOTRIN) 200 MG tablet Take 400 mg by mouth daily as needed for headache or moderate pain.    . Lancets 28G MISC Use 1 each once daily Use as instructed.    Marland Kitchen levothyroxine (SYNTHROID, LEVOTHROID) 75 MCG tablet Take 75 mcg by mouth daily before breakfast.    . metFORMIN (GLUCOPHAGE) 500 MG tablet Take by mouth.    Marland Kitchen omeprazole (PRILOSEC) 20 MG capsule Take 20 mg by mouth daily.    . pravastatin (PRAVACHOL) 20 MG tablet Take 20 mg by mouth daily.    . valsartan (DIOVAN) 80 MG tablet Take 80 mg by mouth daily.    Marland Kitchen amLODipine (NORVASC) 5 MG tablet Take 2 tablets (10 mg total) by mouth daily for 14 days. 28 tablet 0   No current facility-administered medications on file prior to visit.    There are no Patient Instructions on file for this visit. No follow-ups on file.   Kris Hartmann, NP

## 2020-07-25 ENCOUNTER — Ambulatory Visit (INDEPENDENT_AMBULATORY_CARE_PROVIDER_SITE_OTHER): Payer: Medicare Other

## 2020-07-25 ENCOUNTER — Other Ambulatory Visit: Payer: Self-pay

## 2020-07-25 ENCOUNTER — Ambulatory Visit (INDEPENDENT_AMBULATORY_CARE_PROVIDER_SITE_OTHER): Payer: Medicare Other | Admitting: Nurse Practitioner

## 2020-07-25 ENCOUNTER — Encounter (INDEPENDENT_AMBULATORY_CARE_PROVIDER_SITE_OTHER): Payer: Self-pay | Admitting: Nurse Practitioner

## 2020-07-25 VITALS — BP 132/77 | HR 82 | Resp 16 | Wt 128.8 lb

## 2020-07-25 DIAGNOSIS — I739 Peripheral vascular disease, unspecified: Secondary | ICD-10-CM | POA: Diagnosis not present

## 2020-07-25 DIAGNOSIS — I6523 Occlusion and stenosis of bilateral carotid arteries: Secondary | ICD-10-CM | POA: Diagnosis not present

## 2020-07-25 DIAGNOSIS — F17200 Nicotine dependence, unspecified, uncomplicated: Secondary | ICD-10-CM | POA: Diagnosis not present

## 2020-07-25 DIAGNOSIS — E785 Hyperlipidemia, unspecified: Secondary | ICD-10-CM | POA: Diagnosis not present

## 2020-07-25 DIAGNOSIS — E119 Type 2 diabetes mellitus without complications: Secondary | ICD-10-CM

## 2020-07-25 NOTE — Progress Notes (Signed)
Subjective:    Patient ID: Patricia Nguyen, female    DOB: 07/06/1941, 79 y.o.   MRN: [id number] Chief Complaint  Patient presents with  . Follow-up    ultrasound follow up    Patient returns today for follow-up studies regarding claudication symptoms.  The patient notes having tightness and cramping in her calves that is worse with incline.  The patient notes that the claudication is not quite enough to stop her from engaging in her daily activities however it is concerning to her.   There have been no interval changes in lower extremity symptoms. No interval shortening of the patient's claudication distance or development of rest pain symptoms. No new ulcers or wounds have occurred since the last visit.  There have been no significant changes to the patient's overall health care.  The patient denies amaurosis fugax or recent TIA symptoms. There are no recent neurological changes noted. The patient denies history of DVT, PE or superficial thrombophlebitis. The patient denies recent episodes of angina or shortness of breath.   ABI Rt=0.71 and Lt=0.51  (no previous ABIs) Duplex ultrasound of the bilateral anterior tibial arteries reveals biphasic waveforms whereas the posterior tibial arteries have monophasic waveforms.  The patient has good toe waveforms bilaterally.   Review of Systems  Cardiovascular:       Claudication  Musculoskeletal: Positive for arthralgias and back pain.  All other systems reviewed and are negative.      Objective:   Physical Exam Vitals reviewed.  HENT:     Head: Normocephalic.  Cardiovascular:     Rate and Rhythm: Normal rate and regular rhythm.     Pulses: Normal pulses.     Heart sounds: Normal heart sounds.  Pulmonary:     Effort: Pulmonary effort is normal.  Musculoskeletal:        General: Normal range of motion.  Skin:    General: Skin is warm and dry.  Neurological:     Mental Status: She is alert and oriented to person, place, and time.   Psychiatric:        Mood and Affect: Mood normal.        Behavior: Behavior normal.        Thought Content: Thought content normal.        Judgment: Judgment normal.     BP 132/77 (BP Location: Right Arm)   Pulse 82   Resp 16   Wt 128 lb 12.8 oz (58.4 kg)   BMI 20.79 kg/m   Past Medical History:  Diagnosis Date  . Edema    FEET/LEGS  . GERD (gastroesophageal reflux disease)   . HOH (hard of hearing)   . Hypertension   . Hypothyroidism   . Thyroid disease     Social History   Socioeconomic History  . Marital status: Divorced    Spouse name: Not on file  . Number of children: Not on file  . Years of education: Not on file  . Highest education level: Not on file  Occupational History  . Not on file  Tobacco Use  . Smoking status: Current Every Day Smoker    Packs/day: 0.50    Years: 60.00    Pack years: 30.00    Types: Cigarettes  . Smokeless tobacco: Never Used  Substance and Sexual Activity  . Alcohol use: Yes    Comment: occas  . Drug use: No  . Sexual activity: Not on file  Other Topics Concern  . Not on file  Social History Narrative  . Not on file   Social Determinants of Health   Financial Resource Strain:   . Difficulty of Paying Living Expenses:   Food Insecurity:   . Worried About Charity fundraiser in the Last Year:   . Arboriculturist in the Last Year:   Transportation Needs:   . Film/video editor (Medical):   Marland Kitchen Lack of Transportation (Non-Medical):   Physical Activity:   . Days of Exercise per Week:   . Minutes of Exercise per Session:   Stress:   . Feeling of Stress :   Social Connections:   . Frequency of Communication with Friends and Family:   . Frequency of Social Gatherings with Friends and Family:   . Attends Religious Services:   . Active Member of Clubs or Organizations:   . Attends Archivist Meetings:   Marland Kitchen Marital Status:   Intimate Partner Violence:   . Fear of Current or Ex-Partner:   . Emotionally  Abused:   Marland Kitchen Physically Abused:   . Sexually Abused:     Past Surgical History:  Procedure Laterality Date  . ABDOMINAL HYSTERECTOMY    . BACK SURGERY    . [place]     hearing implant  . CATARACT EXTRACTION W/PHACO Left 04/13/2016   Procedure: CATARACT EXTRACTION PHACO AND INTRAOCULAR LENS PLACEMENT (IOC);  Surgeon: Birder Robson, MD;  Location: [facility] ORS;  Service: Ophthalmology;  Laterality: Left;  [place] 53.6 AP% 19.3 CDE 10.32 Fluid Pack Lot # [id number]  . CATARACT EXTRACTION W/PHACO Right 10/31/2018   Procedure: CATARACT EXTRACTION PHACO AND INTRAOCULAR LENS PLACEMENT ([place]);  Surgeon: Birder Robson, MD;  Location: [facility] ORS;  Service: Ophthalmology;  Laterality: Right;  [place] 00:37 CDE 5.23 Fluid pack lot # [id number]    Family History  Problem Relation Age of Onset  . Heart disease Mother   . Stroke Father   . Diabetes Sister   . Parkinson's disease Sister     Allergies  Allergen Reactions  . Lisinopril Other (See Comments)    copugh copugh  cough       Assessment & Plan:   1. PAD (peripheral artery disease) (HCC)  Recommend:  The patient has evidence of atherosclerosis of the lower extremities with claudication.  The patient does not voice lifestyle limiting changes at this point in time.  Noninvasive studies do not suggest clinically significant change.  No invasive studies, angiography or surgery at this time The patient should continue walking and begin a more formal exercise program.  The patient should continue antiplatelet therapy and aggressive treatment of the lipid abnormalities  No changes in the patient's medications at this time  The patient should continue wearing graduated compression socks 10-15 mmHg strength to control the mild edema.   Patient will follow up in 1 year or sooner if the problems worsen.  2. Carotid stenosis, asymptomatic, bilateral Patient had no new symptoms since the patient's most recent follow-up.  We will continue with  annual follow-up as previously scheduled.  Patient will continue with statin, aspirin and blood pressure control.  3. Hyperlipidemia, unspecified hyperlipidemia type Continue statin as ordered and reviewed, no changes at this time   4. Tobacco use disorder Smoking cessation was discussed, 3-10 minutes spent on this topic specifically   5. Type 2 diabetes mellitus without complication, without long-term current use of insulin (HCC) Continue hypoglycemic medications as already ordered, these medications have been reviewed and there are no changes at this time.  Hgb A1C to be monitored as already arranged by primary service    Current Outpatient Medications on File Prior to Visit  Medication Sig Dispense Refill  . amLODipine (NORVASC) 5 MG tablet Take 2 tablets (10 mg total) by mouth daily for 14 days. 28 tablet 0  . aspirin EC 81 MG tablet Take 81 mg by mouth daily.    . Blood Glucose Monitoring Suppl (FIFTY50 GLUCOSE METER 2.0) w/Device KIT Use as directed    . Cholecalciferol (D3-50 PO) Take 50 mcg by mouth daily.    . hydrochlorothiazide (HYDRODIURIL) 25 MG tablet Take 25 mg by mouth daily.    Marland Kitchen ibuprofen (ADVIL,MOTRIN) 200 MG tablet Take 400 mg by mouth daily as needed for headache or moderate pain.    . Lancets 28G MISC Use 1 each once daily Use as instructed.    Marland Kitchen levothyroxine (SYNTHROID, LEVOTHROID) 75 MCG tablet Take 75 mcg by mouth daily before breakfast.    . metFORMIN (GLUCOPHAGE) 500 MG tablet Take by mouth.    Marland Kitchen omeprazole (PRILOSEC) 20 MG capsule Take 20 mg by mouth daily.    . pravastatin (PRAVACHOL) 20 MG tablet Take 20 mg by mouth daily.    . valsartan (DIOVAN) 80 MG tablet Take 80 mg by mouth daily.     No current facility-administered medications on file prior to visit.    There are no Patient Instructions on file for this visit. No follow-ups on file.   Kris Hartmann, NP

## 2021-01-28 ENCOUNTER — Other Ambulatory Visit: Payer: Self-pay | Admitting: Physician Assistant

## 2021-01-28 DIAGNOSIS — M25561 Pain in right knee: Secondary | ICD-10-CM

## 2021-01-28 DIAGNOSIS — M2391 Unspecified internal derangement of right knee: Secondary | ICD-10-CM

## 2021-02-03 ENCOUNTER — Telehealth: Payer: Self-pay | Admitting: *Deleted

## 2021-02-03 DIAGNOSIS — Z87891 Personal history of nicotine dependence: Secondary | ICD-10-CM

## 2021-02-03 DIAGNOSIS — Z122 Encounter for screening for malignant neoplasm of respiratory organs: Secondary | ICD-10-CM

## 2021-02-03 DIAGNOSIS — F17200 Nicotine dependence, unspecified, uncomplicated: Secondary | ICD-10-CM

## 2021-02-03 NOTE — Telephone Encounter (Signed)
Received referral for initial lung cancer screening scan. Contacted patient and obtained smoking history,(current, 57 pack year) as well as answering questions related to screening process. Patient denies signs of lung cancer such as weight loss or hemoptysis. Patient denies comorbidity that would prevent curative treatment if lung cancer were found. Patient is scheduled for shared decision making visit and CT scan on 02/17/21 at 130pm.

## 2021-02-17 ENCOUNTER — Encounter: Payer: Self-pay | Admitting: Nurse Practitioner

## 2021-02-17 ENCOUNTER — Other Ambulatory Visit: Payer: Self-pay

## 2021-02-17 ENCOUNTER — Inpatient Hospital Stay: Payer: Medicare Other | Attending: Nurse Practitioner | Admitting: Nurse Practitioner

## 2021-02-17 ENCOUNTER — Ambulatory Visit
Admission: RE | Admit: 2021-02-17 | Discharge: 2021-02-17 | Disposition: A | Discharge status: Home / Self Care | Payer: Medicare Other | Source: Ambulatory Visit | Attending: Nurse Practitioner | Admitting: Nurse Practitioner

## 2021-02-17 DIAGNOSIS — Z122 Encounter for screening for malignant neoplasm of respiratory organs: Secondary | ICD-10-CM | POA: Diagnosis present

## 2021-02-17 DIAGNOSIS — F17210 Nicotine dependence, cigarettes, uncomplicated: Secondary | ICD-10-CM | POA: Insufficient documentation

## 2021-02-17 DIAGNOSIS — Z87891 Personal history of nicotine dependence: Secondary | ICD-10-CM

## 2021-02-17 DIAGNOSIS — F17200 Nicotine dependence, unspecified, uncomplicated: Secondary | ICD-10-CM

## 2021-02-17 DIAGNOSIS — R918 Other nonspecific abnormal finding of lung field: Secondary | ICD-10-CM | POA: Diagnosis not present

## 2021-02-17 DIAGNOSIS — I700 Atherosclerosis of aorta: Secondary | ICD-10-CM | POA: Insufficient documentation

## 2021-02-17 NOTE — Progress Notes (Signed)
Virtual Visit via Video Enabled Telemedicine Note   I connected with Patricia Nguyen on 02/17/21 at 1:45 PM EST by video enabled telemedicine visit and verified that I am speaking with the correct person using two identifiers.   I discussed the limitations, risks, security and privacy concerns of performing an evaluation and management service by telemedicine and the availability of in-person appointments. I also discussed with the patient that there may be a patient responsible charge related to this service. The patient expressed understanding and agreed to proceed.   Other persons participating in the visit and their role in the encounter: Burgess Estelle, RN- checking in patient & navigation  Patient's location: [place]  Provider's location: Clinic  Chief Complaint: Low Dose CT Screening  Patient agreed to evaluation by telemedicine to discuss shared decision making for consideration of low dose CT lung cancer screening.    In accordance with CMS guidelines, patient has met eligibility criteria including age, absence of signs or symptoms of lung cancer.  Social History   Tobacco Use   Smoking status: Current Every Day Smoker    Packs/day: 1.00    Years: 57.00    Pack years: 57.00    Types: Cigarettes   Smokeless tobacco: Never Used   Tobacco comment: .25ppd last year  Substance Use Topics   Alcohol use: Yes    Comment: occas     A shared decision-making session was conducted prior to the performance of CT scan. This includes one or more decision aids, includes benefits and harms of screening, follow-up diagnostic testing, over-diagnosis, false positive rate, and total radiation exposure.   Counseling on the importance of adherence to annual lung cancer LDCT screening, impact of co-morbidities, and ability or willingness to undergo diagnosis and treatment is imperative for compliance of the program.   Counseling on the importance of continued smoking cessation for  former smokers; the importance of smoking cessation for current smokers, and information about tobacco cessation interventions have been given to patient including [place] and [employer] [place] programs.   Written order for lung cancer screening with LDCT has been given to the patient and any and all questions have been answered to the best of my abilities.    Yearly follow up will be coordinated by Burgess Estelle, Thoracic Navigator.  I discussed the assessment and treatment plan with the patient. The patient was provided an opportunity to ask questions and all were answered. The patient agreed with the plan and demonstrated an understanding of the instructions.   The patient was advised to call back or seek an in-person evaluation if the symptoms worsen or if the condition fails to improve as anticipated.   I provided 15 minutes of face-to-face video visit time dedicated to the care of this patient on the date of this encounter to include pre-visit review of smoking history, face-to-face time with the patient, and post visit ordering of testing/documentation.   Beckey Rutter, DNP, AGNP-C [place] at [facility] [phone] (clinic)

## 2021-02-19 ENCOUNTER — Telehealth: Payer: Self-pay | Admitting: *Deleted

## 2021-02-19 NOTE — Telephone Encounter (Addendum)
Notified patient of LDCT lung cancer screening program results with recommendation for 6 month follow up imaging. Also notified of incidental findings noted below and is encouraged to discuss further with PCP who will receive a copy of this note and/or the CT report. Patient verbalizes understanding.   IMPRESSION: 1. Lung-RADS 3, probably benign findings. Short-term follow-up in 6 months is recommended with repeat low-dose chest CT without contrast (please use the following order, "CT CHEST LCS NODULE FOLLOW-UP W/O CM"). 2. Enlargement of the pulmonary outflow tract and main pulmonary arteries suggests pulmonary arterial hypertension. 3.  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 A review of the prior imaging has been sent to the radiologist.   Radiologist evaluated nodule and reports that it is larger now than it was in 2006 and continues to recommend 6 month follow up. Patient is made aware of this plan.

## 2021-02-20 ENCOUNTER — Other Ambulatory Visit: Payer: Self-pay

## 2021-02-20 ENCOUNTER — Ambulatory Visit
Admission: RE | Admit: 2021-02-20 | Discharge: 2021-02-20 | Disposition: A | Discharge status: Home / Self Care | Payer: Medicare Other | Source: Ambulatory Visit | Attending: Physician Assistant | Admitting: Physician Assistant

## 2021-02-20 DIAGNOSIS — M2391 Unspecified internal derangement of right knee: Secondary | ICD-10-CM

## 2021-02-20 DIAGNOSIS — M25561 Pain in right knee: Secondary | ICD-10-CM

## 2021-03-13 IMAGING — CT CT HEAD W/O CM
3 series · 15 of 47 positions shown, 18 images · non-contrast
Comparison: Head CT dated 06/21/2007

CLINICAL DATA: 78-year-old female with hypertension. Visual changes
today.

EXAM:
CT HEAD WITHOUT CONTRAST
TECHNIQUE: Contiguous axial images were obtained from the base of the skull
through the vertex without intravenous contrast.

[Series 3: head wo · axial · 0.42mm/px · z∈[+450,+575]mm · 9 of 30 slices shown, 12 images]
[im 3/30  brain]
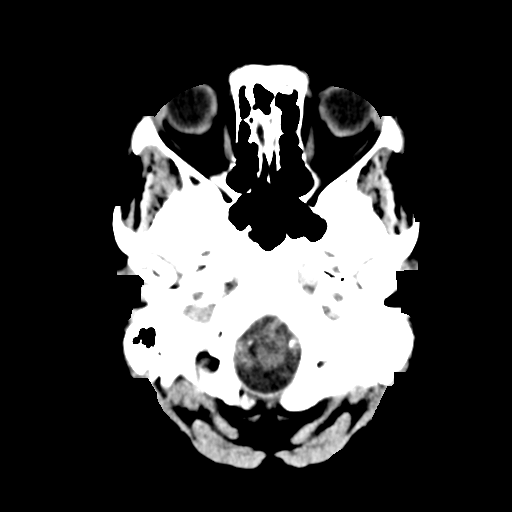
[im 3/30  bone]
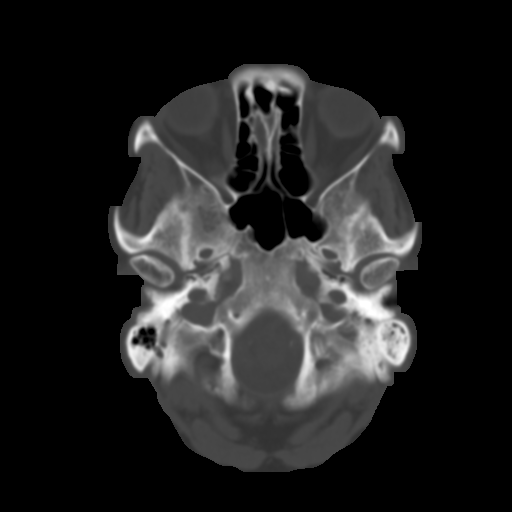
[im 6/30  brain]
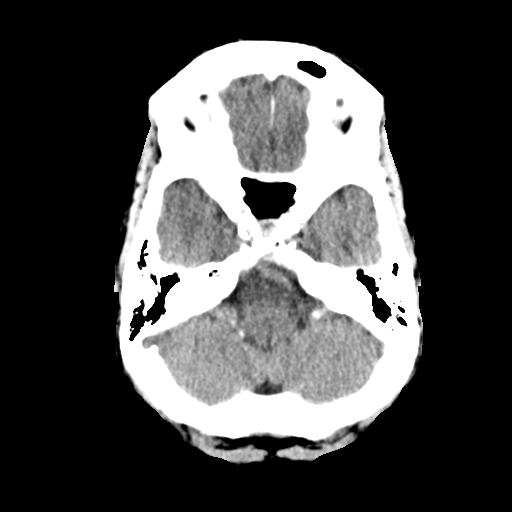
[im 9/30  brain]
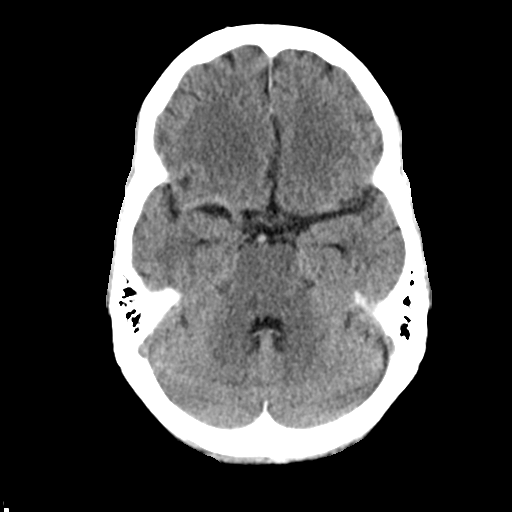
[im 12/30  brain]
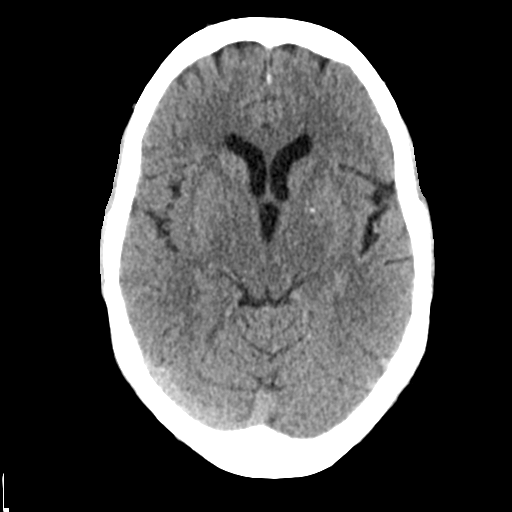
[im 16/30  brain]
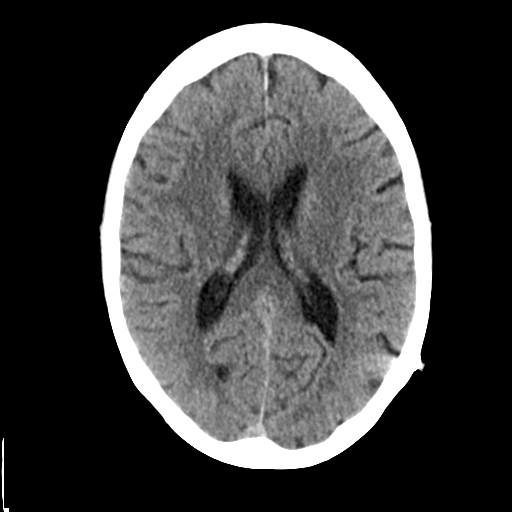
[im 16/30  bone]
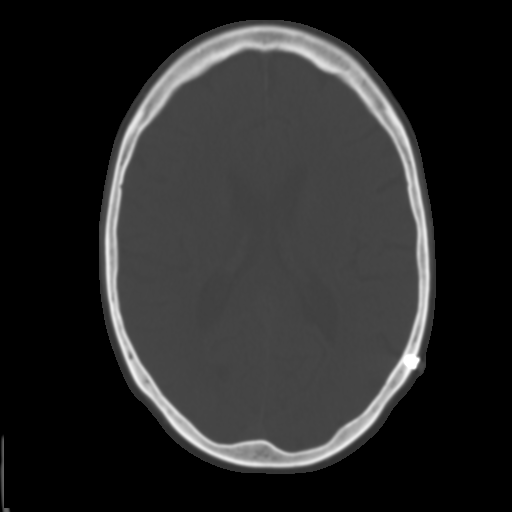
[im 19/30  brain]
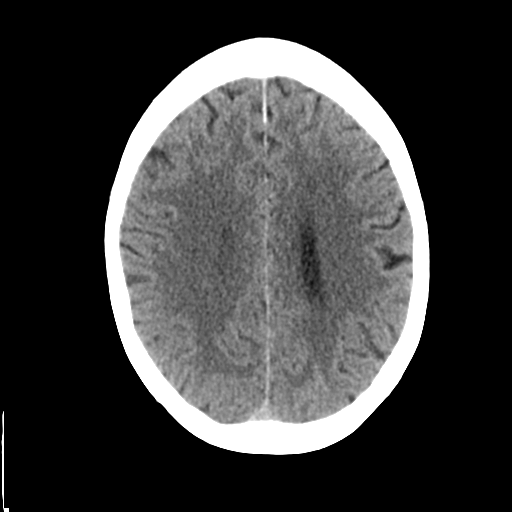
[im 22/30  brain]
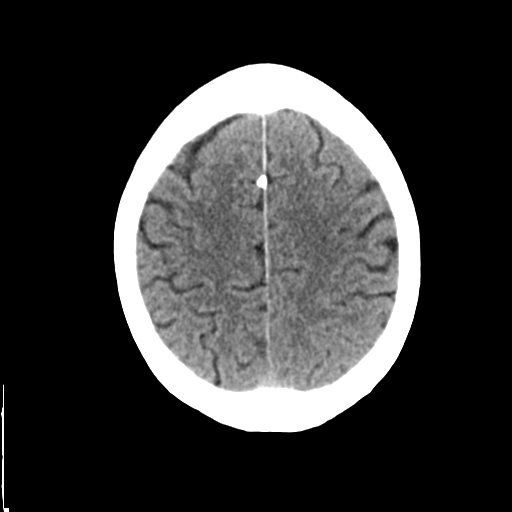
[im 25/30  brain]
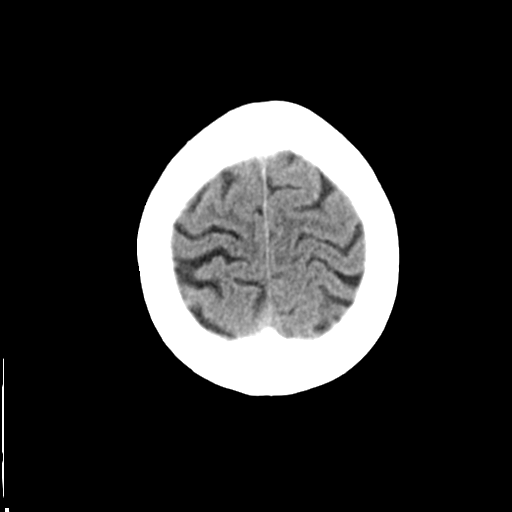
[im 28/30  brain]
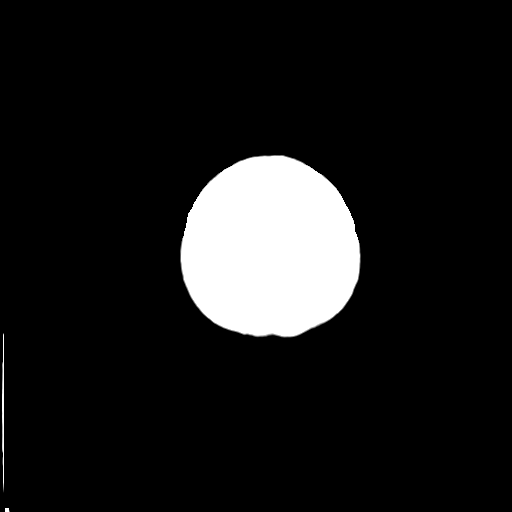
[im 28/30  bone]
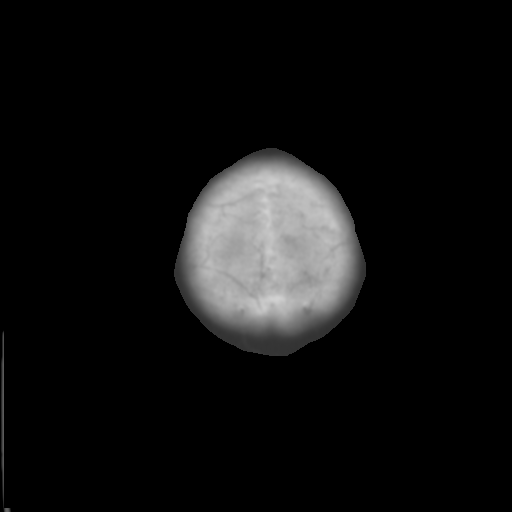

[Series 4: coronal soft tissue · coronal · 0.29mm/px · 3 of 65 slices shown]
[im 22/65  brain]
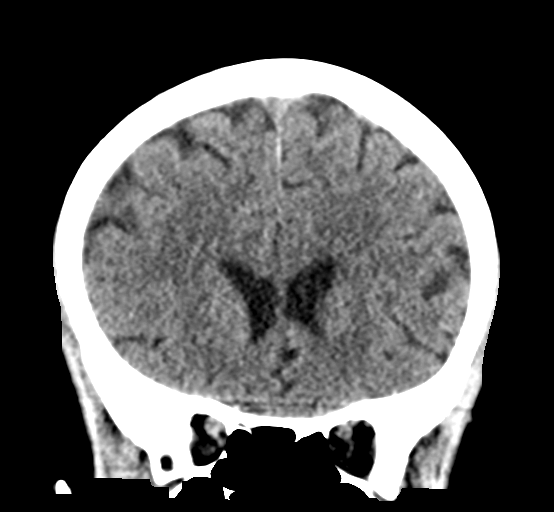
[im 29/65  brain]
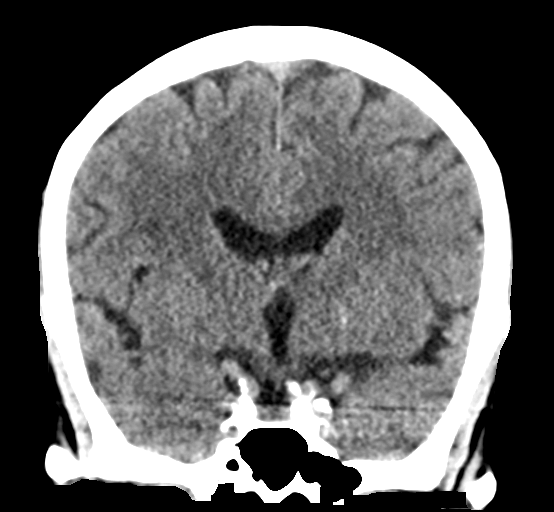
[im 36/65  brain]
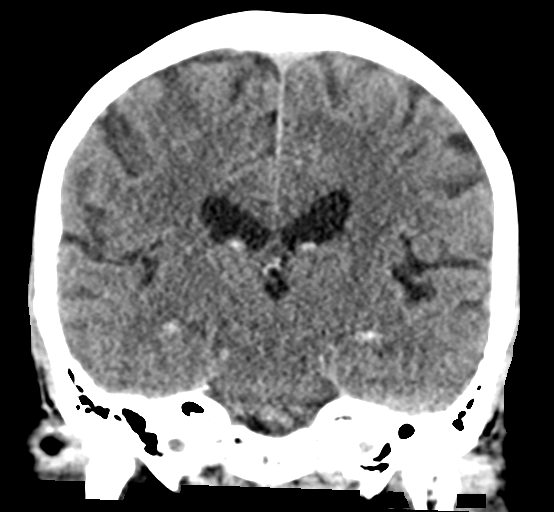

[Series 5: sagittal soft tissue · sagittal · 0.29mm/px · 3 of 48 slices shown]
[im 16/48  brain]
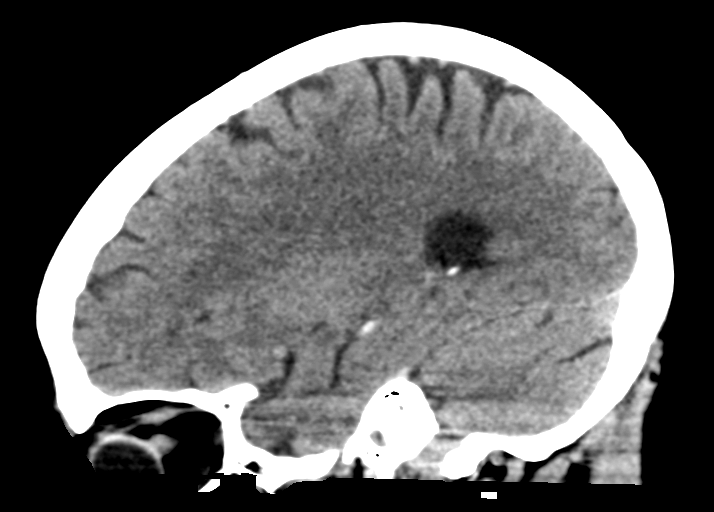
[im 24/48  brain]
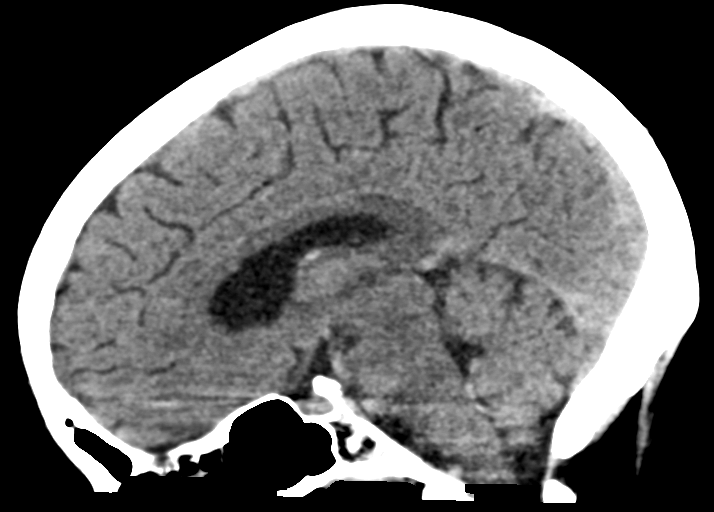
[im 32/48  brain]
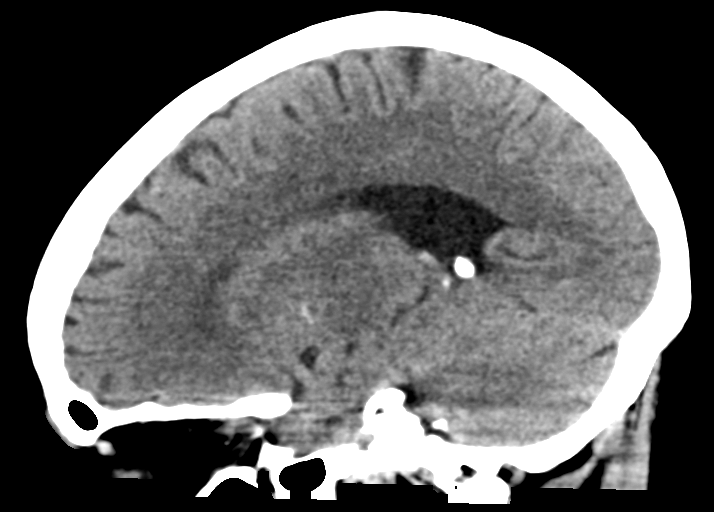

[15 of 47 positions shown; findings below may reference images not displayed]

FINDINGS: Brain: The ventricles and sulci appropriate size for patient's age.
Minimal periventricular and deep white matter chronic microvascular
ischemic changes noted. There is no acute intracranial hemorrhage.
No mass effect or midline shift. No extra-axial fluid collection.

Vascular: No hyperdense vessel or unexpected calcification.

Skull: Normal. Negative for fracture or focal lesion.

Sinuses/Orbits: No acute finding.

Other: None.
IMPRESSION: No acute intracranial pathology.

## 2021-06-30 ENCOUNTER — Encounter (INDEPENDENT_AMBULATORY_CARE_PROVIDER_SITE_OTHER): Payer: Medicare Other

## 2021-06-30 ENCOUNTER — Ambulatory Visit (INDEPENDENT_AMBULATORY_CARE_PROVIDER_SITE_OTHER): Payer: Medicare Other | Admitting: Vascular Surgery

## 2021-07-06 ENCOUNTER — Other Ambulatory Visit (INDEPENDENT_AMBULATORY_CARE_PROVIDER_SITE_OTHER): Payer: Self-pay | Admitting: Nurse Practitioner

## 2021-07-06 DIAGNOSIS — I739 Peripheral vascular disease, unspecified: Secondary | ICD-10-CM

## 2021-07-06 DIAGNOSIS — I6523 Occlusion and stenosis of bilateral carotid arteries: Secondary | ICD-10-CM

## 2021-07-07 ENCOUNTER — Ambulatory Visit (INDEPENDENT_AMBULATORY_CARE_PROVIDER_SITE_OTHER): Payer: Medicare Other

## 2021-07-07 ENCOUNTER — Encounter (INDEPENDENT_AMBULATORY_CARE_PROVIDER_SITE_OTHER): Payer: Self-pay | Admitting: Vascular Surgery

## 2021-07-07 ENCOUNTER — Ambulatory Visit (INDEPENDENT_AMBULATORY_CARE_PROVIDER_SITE_OTHER): Payer: Medicare Other | Admitting: Vascular Surgery

## 2021-07-07 ENCOUNTER — Other Ambulatory Visit: Payer: Self-pay

## 2021-07-07 VITALS — BP 147/68 | HR 76 | Resp 16 | Wt 132.8 lb

## 2021-07-07 DIAGNOSIS — Q2730 Arteriovenous malformation, site unspecified: Secondary | ICD-10-CM | POA: Diagnosis not present

## 2021-07-07 DIAGNOSIS — I6523 Occlusion and stenosis of bilateral carotid arteries: Secondary | ICD-10-CM

## 2021-07-07 DIAGNOSIS — E119 Type 2 diabetes mellitus without complications: Secondary | ICD-10-CM | POA: Diagnosis not present

## 2021-07-07 DIAGNOSIS — E785 Hyperlipidemia, unspecified: Secondary | ICD-10-CM | POA: Diagnosis not present

## 2021-07-07 DIAGNOSIS — I70213 Atherosclerosis of native arteries of extremities with intermittent claudication, bilateral legs: Secondary | ICD-10-CM

## 2021-07-07 DIAGNOSIS — I70219 Atherosclerosis of native arteries of extremities with intermittent claudication, unspecified extremity: Secondary | ICD-10-CM | POA: Insufficient documentation

## 2021-07-07 DIAGNOSIS — I739 Peripheral vascular disease, unspecified: Secondary | ICD-10-CM

## 2021-07-07 NOTE — Progress Notes (Signed)
MRN : [id number]  Patricia Nguyen is a 80 y.o. (04/07/1941) female who presents with chief complaint of  Chief Complaint  Patient presents with   Follow-up    Ultrasound follow up  .  History of Present Illness: Patient returns today in follow up of multiple vascular issues.  She says she is doing well today without any complaints.  She denies any focal neurologic symptoms.  Carotid duplex today shows 40 to 59% right ICA stenosis and 1 to 39% left ICA stenosis. She is also followed for peripheral arterial disease.  The patient reports starting a physical therapy regimen which improved her leg strength and she has been walking a lot more since her last visit.  She says her legs are noticeably better than they were the last time she was here.  No open wounds or infection.  No fevers or chills.  No rest pain. ABIs today are improved to 0.81 on the right and 0.85 on the left with a digital pressure of 99 on the right and 90 on the left.  This is improved from 0.71 and 0.51 on the right and left at last check.   Current Outpatient Medications  Medication Sig Dispense Refill   aspirin EC 81 MG tablet Take 81 mg by mouth daily.     Cholecalciferol (D3-50 PO) Take 50 mcg by mouth daily.     hydrochlorothiazide (HYDRODIURIL) 25 MG tablet Take 25 mg by mouth daily.     ibuprofen (ADVIL,MOTRIN) 200 MG tablet Take 400 mg by mouth daily as needed for headache or moderate pain.     Lancets 28G MISC Use 1 each once daily Use as instructed.     levothyroxine (SYNTHROID, LEVOTHROID) 75 MCG tablet Take 75 mcg by mouth daily before breakfast.     metFORMIN (GLUCOPHAGE) 500 MG tablet Take by mouth.     omeprazole (PRILOSEC) 20 MG capsule Take 20 mg by mouth daily.     pravastatin (PRAVACHOL) 20 MG tablet Take 20 mg by mouth daily.     valsartan (DIOVAN) 80 MG tablet Take 80 mg by mouth daily.     amLODipine (NORVASC) 5 MG tablet Take 2 tablets (10 mg total) by mouth daily for 14 days. 28 tablet 0   No current  facility-administered medications for this visit.    Past Medical History:  Diagnosis Date   Edema    FEET/LEGS   GERD (gastroesophageal reflux disease)    HOH (hard of hearing)    Hypertension    Hypothyroidism    Thyroid disease     Past Surgical History:  Procedure Laterality Date   ABDOMINAL HYSTERECTOMY     BACK SURGERY     [place]     hearing implant   CATARACT EXTRACTION W/PHACO Left 04/13/2016   Procedure: CATARACT EXTRACTION PHACO AND INTRAOCULAR LENS PLACEMENT (IOC);  Surgeon: Galen Manila, MD;  Location: [facility] ORS;  Service: Ophthalmology;  Laterality: Left;  [place] 53.6 AP% 19.3 CDE 10.32 Fluid Pack Lot # [id number]   CATARACT EXTRACTION W/PHACO Right 10/31/2018   Procedure: CATARACT EXTRACTION PHACO AND INTRAOCULAR LENS PLACEMENT (IOC);  Surgeon: Galen Manila, MD;  Location: [facility] ORS;  Service: Ophthalmology;  Laterality: Right;  [place] 00:37 CDE 5.23 Fluid pack lot # [id number]     Social History   Tobacco Use   Smoking status: Every Day    Packs/day: 1.00    Years: 57.00    Pack years: 57.00    Types: Cigarettes   Smokeless tobacco:  Never   Tobacco comments:    .25ppd last year  Substance Use Topics   Alcohol use: Yes    Comment: occas   Drug use: No      Family History  Problem Relation Age of Onset   Heart disease Mother    Stroke Father    Diabetes Sister    Parkinson's disease Sister      Allergies  Allergen Reactions   Lisinopril Other (See Comments)    copugh copugh  cough     REVIEW OF SYSTEMS (Negative unless checked)   Constitutional: [] Weight loss  [] Fever  [] Chills Cardiac: [] Chest pain   [] Chest pressure   [] Palpitations   [] Shortness of breath when laying flat   [] Shortness of breath at rest   [] Shortness of breath with exertion. Vascular:  [] Pain in legs with walking   [] Pain in legs at rest   [] Pain in legs when laying flat   [] Claudication   [] Pain in feet when walking  [] Pain in feet at rest  [] Pain in feet when laying  flat   [] History of DVT   [] Phlebitis   [x] Swelling in legs   [] Varicose veins   [] Non-healing ulcers Pulmonary:   [] Uses home oxygen   [] Productive cough   [] Hemoptysis   [] Wheeze  [] COPD   [] Asthma Neurologic:  [] Dizziness  [] Blackouts   [] Seizures   [] History of stroke   [] History of TIA  [] Aphasia   [] Temporary blindness   [] Dysphagia   [] Weakness or numbness in arms   [] Weakness or numbness in legs X positive for headaches Musculoskeletal:  [x] Arthritis   [] Joint swelling   [] Joint pain   [] Low back pain Hematologic:  [] Easy bruising  [] Easy bleeding   [] Hypercoagulable state   [] Anemic  [] Hepatitis Gastrointestinal:  [] Blood in stool   [] Vomiting blood  [x] Gastroesophageal reflux/heartburn   [] Abdominal pain Genitourinary:  [] Chronic kidney disease   [] Difficult urination  [] Frequent urination  [] Burning with urination   [] Hematuria Skin:  [] Rashes   [] Ulcers   [] Wounds Psychological:  [] History of anxiety   []  History of major depression.    Physical Examination  BP (!) 147/68 (BP Location: Right Arm)   Pulse 76   Resp 16   Wt 132 lb 12.8 oz (60.2 kg)   BMI 21.43 kg/m  Gen:  WD/WN, NAD Head: [place]/AT, No temporalis wasting. Ear/Nose/Throat: Hearing grossly intact, nares w/o erythema or drainage Eyes: Conjunctiva clear. Sclera non-icteric Neck: Supple.  Trachea midline.  Soft right carotid bruit Pulmonary:  Good air movement, no use of accessory muscles.  Cardiac: RRR, no JVD Vascular:  Vessel Right Left  Radial Palpable Palpable                          PT 1+ Palpable 1+ Palpable  DP 2+ Palpable 2+ Palpable   Gastrointestinal: soft, non-tender/non-distended. No guarding/reflex.  Musculoskeletal: M/S 5/5 throughout.  No deformity or atrophy.  Mild lower extremity edema. Neurologic: Sensation grossly intact in extremities.  Symmetrical.  Speech is fluent.  Psychiatric: Judgment intact, Mood & affect appropriate for pt's clinical situation. Dermatologic: No rashes or ulcers  noted.  No cellulitis or open wounds.      Labs No results found for this or any previous visit (from the past 2160 hour(s)).  Radiology No results found.  Assessment/Plan  Type 2 diabetes mellitus (HCC) blood glucose control important in reducing the progression of atherosclerotic disease. Also, involved in wound healing. On appropriate medications.  Essential hypertension blood pressure control important in reducing the progression of atherosclerotic disease. On appropriate oral medications.     Hyperlipidemia lipid control important in reducing the progression of atherosclerotic disease. Continue statin therapy  Carotid stenosis, asymptomatic, bilateral Carotid duplex today shows 40 to 59% right ICA stenosis and 1 to 39% left ICA stenosis.  No role for intervention at that level.  Continue to monitor with duplex in 6 months.  Atherosclerosis of native arteries of extremity with intermittent claudication (HCC) ABIs today are improved to 0.81 on the right and 0.85 on the left with a digital pressure of 99 on the right and 90 on the left.  This is improved from 0.71 and 0.51 on the right and left at last check.  This is likely due to increased activity after physical therapy and markedly improving her walking abilities and distances.  No role for intervention.  Plan to recheck in 1 year.    Festus Barren, MD  07/07/2021 2:59 PM    This note was created with Dragon medical transcription system.  Any errors from dictation are purely unintentional

## 2021-07-07 NOTE — Assessment & Plan Note (Signed)
ABIs today are improved to 0.81 on the right and 0.85 on the left with a digital pressure of 99 on the right and 90 on the left.  This is improved from 0.71 and 0.51 on the right and left at last check.  This is likely due to increased activity after physical therapy and markedly improving her walking abilities and distances.  No role for intervention.  Plan to recheck in 1 year.

## 2021-07-07 NOTE — Assessment & Plan Note (Signed)
Carotid duplex today shows 40 to 59% right ICA stenosis and 1 to 39% left ICA stenosis.  No role for intervention at that level.  Continue to monitor with duplex in 6 months.

## 2022-01-07 ENCOUNTER — Telehealth: Payer: Self-pay | Admitting: Acute Care

## 2022-01-07 NOTE — Telephone Encounter (Signed)
Left detailed message for pt to call back to schedule f/u lung screening CT scan.  °

## 2022-01-08 ENCOUNTER — Other Ambulatory Visit: Payer: Self-pay | Admitting: *Deleted

## 2022-01-08 DIAGNOSIS — F17210 Nicotine dependence, cigarettes, uncomplicated: Secondary | ICD-10-CM

## 2022-01-08 DIAGNOSIS — Z87891 Personal history of nicotine dependence: Secondary | ICD-10-CM

## 2022-01-25 ENCOUNTER — Ambulatory Visit
Admission: RE | Admit: 2022-01-25 | Discharge: 2022-01-25 | Disposition: A | Discharge status: Home / Self Care | Payer: Medicare Other | Source: Ambulatory Visit | Attending: Acute Care | Admitting: Acute Care

## 2022-01-25 ENCOUNTER — Other Ambulatory Visit: Payer: Self-pay

## 2022-01-25 DIAGNOSIS — I700 Atherosclerosis of aorta: Secondary | ICD-10-CM | POA: Diagnosis not present

## 2022-01-25 DIAGNOSIS — F17210 Nicotine dependence, cigarettes, uncomplicated: Secondary | ICD-10-CM | POA: Insufficient documentation

## 2022-01-25 DIAGNOSIS — I2510 Atherosclerotic heart disease of native coronary artery without angina pectoris: Secondary | ICD-10-CM | POA: Diagnosis not present

## 2022-01-25 DIAGNOSIS — Z122 Encounter for screening for malignant neoplasm of respiratory organs: Secondary | ICD-10-CM | POA: Diagnosis present

## 2022-01-25 DIAGNOSIS — J439 Emphysema, unspecified: Secondary | ICD-10-CM | POA: Insufficient documentation

## 2022-01-25 DIAGNOSIS — Z87891 Personal history of nicotine dependence: Secondary | ICD-10-CM

## 2022-01-26 ENCOUNTER — Other Ambulatory Visit: Payer: Self-pay

## 2022-01-26 ENCOUNTER — Telehealth: Payer: Self-pay | Admitting: Acute Care

## 2022-01-26 DIAGNOSIS — F17210 Nicotine dependence, cigarettes, uncomplicated: Secondary | ICD-10-CM

## 2022-01-26 DIAGNOSIS — Z87891 Personal history of nicotine dependence: Secondary | ICD-10-CM

## 2022-01-26 NOTE — Telephone Encounter (Signed)
Left voicemail and call back number to call for LDCT results

## 2022-01-27 NOTE — Telephone Encounter (Signed)
Attempted to call results of LDCT but unable to reach. Will mail letter

## 2022-07-06 ENCOUNTER — Ambulatory Visit (INDEPENDENT_AMBULATORY_CARE_PROVIDER_SITE_OTHER): Payer: Medicare Other

## 2022-07-06 ENCOUNTER — Encounter (INDEPENDENT_AMBULATORY_CARE_PROVIDER_SITE_OTHER): Payer: Self-pay | Admitting: Nurse Practitioner

## 2022-07-06 ENCOUNTER — Ambulatory Visit (INDEPENDENT_AMBULATORY_CARE_PROVIDER_SITE_OTHER): Payer: Medicare Other | Admitting: Nurse Practitioner

## 2022-07-06 VITALS — BP 154/66 | HR 71 | Resp 16 | Wt 135.8 lb

## 2022-07-06 DIAGNOSIS — I70213 Atherosclerosis of native arteries of extremities with intermittent claudication, bilateral legs: Secondary | ICD-10-CM | POA: Diagnosis not present

## 2022-07-06 DIAGNOSIS — I6523 Occlusion and stenosis of bilateral carotid arteries: Secondary | ICD-10-CM

## 2022-07-06 DIAGNOSIS — E785 Hyperlipidemia, unspecified: Secondary | ICD-10-CM

## 2022-07-06 DIAGNOSIS — E119 Type 2 diabetes mellitus without complications: Secondary | ICD-10-CM | POA: Diagnosis not present

## 2022-07-07 ENCOUNTER — Encounter (INDEPENDENT_AMBULATORY_CARE_PROVIDER_SITE_OTHER): Payer: Self-pay | Admitting: Nurse Practitioner

## 2022-07-07 NOTE — Progress Notes (Signed)
Subjective:    Patient ID: Patricia Nguyen, female    DOB: 10-22-1941, 81 y.o.   MRN: [id number] Chief Complaint  Patient presents with   Follow-up    Ultrasound follow up     Patient returns today in follow up of multiple vascular issues.  She says she is doing well today without any complaints.  She denies any focal neurologic symptoms.  Carotid duplex today shows 1 to 39% ICA stenosis bilaterally which is slightly less on the left than previous. She continues to have claudication-like symptoms but they are improving after having physical therapy.  She denies any open wounds or ulcerations.  She denies any TIA or amaurosis fugax-like symptoms.  The patient's ABI today notes an ABI 0.2 on the right and 0.87 on the left.  Previously it was 0.81 on the right and 0.85 on the left.  Overall not a significant change.  Primarily monophasic tibial artery waveforms bilaterally.    Review of Systems  Cardiovascular:  Negative for leg swelling.  All other systems reviewed and are negative.      Objective:   Physical Exam Vitals reviewed.  HENT:     Head: Normocephalic.  Neck:     Vascular: No carotid bruit.  Cardiovascular:     Rate and Rhythm: Normal rate.  Pulmonary:     Effort: Pulmonary effort is normal.  Skin:    General: Skin is warm and dry.  Neurological:     Mental Status: She is alert and oriented to person, place, and time.  Psychiatric:        Mood and Affect: Mood normal.        Behavior: Behavior normal.        Thought Content: Thought content normal.        Judgment: Judgment normal.     BP (!) 154/66 (BP Location: Right Arm)   Pulse 71   Resp 16   Wt 135 lb 12.8 oz (61.6 kg)   BMI 21.92 kg/m   Past Medical History:  Diagnosis Date   Edema    FEET/LEGS   GERD (gastroesophageal reflux disease)    HOH (hard of hearing)    Hypertension    Hypothyroidism    Thyroid disease     Social History   Socioeconomic History   Marital status: Divorced    Spouse  name: Not on file   Number of children: Not on file   Years of education: Not on file   Highest education level: Not on file  Occupational History   Not on file  Tobacco Use   Smoking status: Every Day    Packs/day: 1.00    Years: 57.00    Total pack years: 57.00    Types: Cigarettes   Smokeless tobacco: Never   Tobacco comments:    .25ppd last year  Substance and Sexual Activity   Alcohol use: Yes    Comment: occas   Drug use: No   Sexual activity: Not on file  Other Topics Concern   Not on file  Social History Narrative   Not on file   Social Determinants of Health   Financial Resource Strain: Not on file  Food Insecurity: Not on file  Transportation Needs: Not on file  Physical Activity: Not on file  Stress: Not on file  Social Connections: Not on file  Intimate Partner Violence: Not on file    Past Surgical History:  Procedure Laterality Date   ABDOMINAL HYSTERECTOMY  BACK SURGERY     baja     hearing implant   CATARACT EXTRACTION W/PHACO Left 04/13/2016   Procedure: CATARACT EXTRACTION PHACO AND INTRAOCULAR LENS PLACEMENT (IOC);  Surgeon: Birder Robson, MD;  Location: [facility] ORS;  Service: Ophthalmology;  Laterality: Left;  [place] 53.6 AP% 19.3 CDE 10.32 Fluid Pack Lot # [id number]   CATARACT EXTRACTION W/PHACO Right 10/31/2018   Procedure: CATARACT EXTRACTION PHACO AND INTRAOCULAR LENS PLACEMENT (IOC);  Surgeon: Birder Robson, MD;  Location: [facility] ORS;  Service: Ophthalmology;  Laterality: Right;  [place] 00:37 CDE 5.23 Fluid pack lot # [id number]    Family History  Problem Relation Age of Onset   Heart disease Mother    Stroke Father    Diabetes Sister    Parkinson's disease Sister     Allergies  Allergen Reactions   Lisinopril Other (See Comments)    copugh copugh  cough       Latest Ref Rng & Units 09/29/2019   11:08 PM  CBC  WBC 4.0 - 10.5 K/uL 11.2   Hemoglobin 12.0 - 15.0 g/dL 14.9   Hematocrit 36.0 - 46.0 % 45.7   Platelets 150 - 400  K/uL 186       CMP     Component Value Date/Time   NA 138 09/29/2019 2308   K 3.3 (L) 09/29/2019 2308   CL 100 09/29/2019 2308   CO2 26 09/29/2019 2308   GLUCOSE 122 (H) 09/29/2019 2308   BUN 10 09/29/2019 2308   CREATININE 0.95 09/29/2019 2308   CALCIUM 10.3 09/29/2019 2308   GFRNONAA 58 (L) 09/29/2019 2308   GFRAA >60 09/29/2019 2308     No results found.     Assessment & Plan:   1. Atherosclerosis of native artery of both lower extremities with intermittent claudication (HCC)  Recommend:  The patient has evidence of atherosclerosis of the lower extremities with claudication.  The patient does not voice lifestyle limiting changes at this point in time.  Noninvasive studies do not suggest clinically significant change.  No invasive studies, angiography or surgery at this time The patient should continue walking and begin a more formal exercise program.  The patient should continue antiplatelet therapy and aggressive treatment of the lipid abnormalities  No changes in the patient's medications at this time  Continued surveillance is indicated as atherosclerosis is likely to progress with time.    The patient will continue follow up with noninvasive studies as ordered.    2. Carotid stenosis, asymptomatic, bilateral Recommend:  Given the patient's asymptomatic subcritical stenosis no further invasive testing or surgery at this time.  Duplex ultrasound shows <40% stenosis bilaterally.  Continue antiplatelet therapy as prescribed Continue management of CAD, HTN and Hyperlipidemia Healthy heart diet,  encouraged exercise at least 4 times per week Follow up in 12 months with duplex ultrasound and physical exam    3. Type 2 diabetes mellitus without complication, without long-term current use of insulin (HCC) Continue hypoglycemic medications as already ordered, these medications have been reviewed and there are no changes at this time.  Hgb A1C to be monitored as  already arranged by primary service   4. Hyperlipidemia, unspecified hyperlipidemia type Continue statin as ordered and reviewed, no changes at this time    Current Outpatient Medications on File Prior to Visit  Medication Sig Dispense Refill   aspirin EC 81 MG tablet Take 81 mg by mouth daily.     Cholecalciferol (D3-50 PO) Take 50 mcg by mouth daily.  hydrochlorothiazide (HYDRODIURIL) 25 MG tablet Take 25 mg by mouth daily.     ibuprofen (ADVIL,MOTRIN) 200 MG tablet Take 400 mg by mouth daily as needed for headache or moderate pain.     Lancets 28G MISC Use 1 each once daily Use as instructed.     levothyroxine (SYNTHROID, LEVOTHROID) 75 MCG tablet Take 75 mcg by mouth daily before breakfast.     metFORMIN (GLUCOPHAGE) 500 MG tablet Take by mouth.     omeprazole (PRILOSEC) 20 MG capsule Take 20 mg by mouth daily.     pravastatin (PRAVACHOL) 20 MG tablet Take 20 mg by mouth daily.     valsartan (DIOVAN) 80 MG tablet Take 80 mg by mouth daily.     amLODipine (NORVASC) 5 MG tablet Take 2 tablets (10 mg total) by mouth daily for 14 days. 28 tablet 0   No current facility-administered medications on file prior to visit.    There are no Patient Instructions on file for this visit. No follow-ups on file.   Georgiana Spinner, NP

## 2023-01-26 ENCOUNTER — Ambulatory Visit
Admission: RE | Admit: 2023-01-26 | Discharge: 2023-01-26 | Disposition: A | Discharge status: Home / Self Care | Payer: 59 | Source: Ambulatory Visit | Attending: Acute Care | Admitting: Acute Care

## 2023-01-26 DIAGNOSIS — I700 Atherosclerosis of aorta: Secondary | ICD-10-CM | POA: Diagnosis not present

## 2023-01-26 DIAGNOSIS — Z122 Encounter for screening for malignant neoplasm of respiratory organs: Secondary | ICD-10-CM | POA: Insufficient documentation

## 2023-01-26 DIAGNOSIS — Z87891 Personal history of nicotine dependence: Secondary | ICD-10-CM | POA: Insufficient documentation

## 2023-01-26 DIAGNOSIS — F17210 Nicotine dependence, cigarettes, uncomplicated: Secondary | ICD-10-CM | POA: Insufficient documentation

## 2023-01-26 DIAGNOSIS — I2510 Atherosclerotic heart disease of native coronary artery without angina pectoris: Secondary | ICD-10-CM | POA: Diagnosis not present

## 2023-01-26 DIAGNOSIS — J439 Emphysema, unspecified: Secondary | ICD-10-CM | POA: Insufficient documentation

## 2023-01-26 DIAGNOSIS — R911 Solitary pulmonary nodule: Secondary | ICD-10-CM | POA: Insufficient documentation

## 2023-02-09 ENCOUNTER — Telehealth: Payer: Self-pay | Admitting: *Deleted

## 2023-02-09 DIAGNOSIS — R911 Solitary pulmonary nodule: Secondary | ICD-10-CM

## 2023-02-09 DIAGNOSIS — Z87891 Personal history of nicotine dependence: Secondary | ICD-10-CM

## 2023-02-09 NOTE — Telephone Encounter (Signed)
Left message for pt to call back regarding lung screening CT results.  Per Dr Patsey Berthold pt will need a 6 mth follow up Chest CT and a referral to [facility] for Fibrosis. Since pt is 82 yrs old this will need ot be ordered as a Chest CT W/O Contrast

## 2023-02-10 ENCOUNTER — Other Ambulatory Visit: Payer: Self-pay | Admitting: *Deleted

## 2023-02-10 DIAGNOSIS — R911 Solitary pulmonary nodule: Secondary | ICD-10-CM

## 2023-02-10 NOTE — Telephone Encounter (Signed)
Patient returned call for results.  Pulmonary review of results with recommendation for follow up CT Chest wo in 6 months due to new nodule in right lower lung.  Prefer to follow nodule sooner than 12 months, as precaution. Atherosclerosis and emphysema as previously seen. Informed patient that after age 82 years, we do not continue LDCT.  Would like to refer her to pulmonology to follow her.  She has previously seen Pulmonology when she lived in [place].  Patient agrees to 6 months follow up Ct and to pulmonary referral.  Orders placed.

## 2023-03-17 ENCOUNTER — Ambulatory Visit (INDEPENDENT_AMBULATORY_CARE_PROVIDER_SITE_OTHER): Payer: 59 | Admitting: Pulmonary Disease

## 2023-03-17 ENCOUNTER — Encounter: Payer: Self-pay | Admitting: Pulmonary Disease

## 2023-03-17 VITALS — BP 138/78 | HR 77 | Temp 97.1°F | Ht 64.5 in | Wt 135.4 lb

## 2023-03-17 DIAGNOSIS — J439 Emphysema, unspecified: Secondary | ICD-10-CM | POA: Diagnosis not present

## 2023-03-17 DIAGNOSIS — R918 Other nonspecific abnormal finding of lung field: Secondary | ICD-10-CM | POA: Diagnosis not present

## 2023-03-17 DIAGNOSIS — F17210 Nicotine dependence, cigarettes, uncomplicated: Secondary | ICD-10-CM | POA: Diagnosis not present

## 2023-03-17 NOTE — Patient Instructions (Signed)
We have ordered a breathing test.  We have also ordered a scan of the lungs to be done in 6 months from your February scan.  We will see you in follow-up in 3 months time call sooner should any new problems arise.

## 2023-03-17 NOTE — Progress Notes (Signed)
Subjective:    Patient ID: Patricia Nguyen, female    DOB: 05-Jul-1941, 82 y.o.   MRN: [id number] Patient Care Team: Adin Hector, MD as PCP - General (Internal Medicine)  Chief Complaint  Patient presents with   Consult    Nodule. No SOB and wheezing. Cough with clear sputum.   HPI Patient is an 82 year old current smoker with 114-pack-year history of smoking and a medical history as noted below, presents for evaluation of lung nodules noted on CT chest lung cancer screening.  She is kindly referred by Eric Form, NP.  Her primary care provider is Dr. Tama High III.  Review of the patient's records through [facility] shows that she previously followed with regards to lung cancer screening at [facility].  She has had chronic findings of bilateral lung nodules more prominent on the right and diffuse groundglass attenuation of the lungs as well as emphysema.  First mention of these findings was in October 2016 by report from [facility] radiology.  Review of her prior imaging available has shown that there is little change on these findings throughout the years.  The patient is totally asymptomatic with regards to these findings.  At most he used to smoke 2 to 2-1/2 packs of cigarettes per day.  She started smoking around age 66.  Currently she is smoking a half a pack of cigarettes per day.  She does not endorse dyspnea, wheezing or chest pain.  No chest tightness.  She has cough productive of clear sputum particularly in the mornings but this does not last throughout the rest of the day.  No hemoptysis.  She has had no weight loss or anorexia.  No gastroesophageal reflux symptoms.  No other symptomatology.  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  No significant occupational exposures.   Review of Systems A 10 point review of systems was performed and it is as noted above otherwise negative.  Past Medical History:  Diagnosis Date   Edema    FEET/LEGS    GERD (gastroesophageal reflux disease)    HOH (hard of hearing)    Hypertension    Hypothyroidism    Thyroid disease    Past Surgical History:  Procedure Laterality Date   ABDOMINAL HYSTERECTOMY     BACK SURGERY     baja     hearing implant   CATARACT EXTRACTION W/PHACO Left 04/13/2016   Procedure: CATARACT EXTRACTION PHACO AND INTRAOCULAR LENS PLACEMENT ([place]);  Surgeon: Birder Robson, MD;  Location: [facility] ORS;  Service: Ophthalmology;  Laterality: Left;  [place] 53.6 AP% 19.3 CDE 10.32 Fluid Pack Lot # [id number]   CATARACT EXTRACTION W/PHACO Right 10/31/2018   Procedure: CATARACT EXTRACTION PHACO AND INTRAOCULAR LENS PLACEMENT (IOC);  Surgeon: Birder Robson, MD;  Location: [facility] ORS;  Service: Ophthalmology;  Laterality: Right;  [place] 00:37 CDE 5.23 Fluid pack lot # [id number]   Patient Active Problem List   Diagnosis Date Noted   Atherosclerosis of native arteries of extremity with intermittent claudication 07/07/2021   Essential hypertension 10/19/2019   Hyperlipidemia 10/19/2019   AVM (arteriovenous malformation) 10/11/2019   Carotid stenosis, asymptomatic, bilateral 10/11/2019   Pulmonary nodules 02/23/2018   Pre-diabetes 06/22/2017   Encounter for screening for lung cancer 02/16/2017   Osteopenia of femoral neck 10/08/2016   Tobacco use disorder 09/07/2016   Panlobular emphysema 06/16/2016   Positive FIT (fecal immunochemical test) 04/05/2016   Type 2 diabetes mellitus 01/05/2016  GERD (gastroesophageal reflux disease) 05/04/2013   Sensorineural hearing loss 01/05/2013   Hypothyroidism 03/16/2010   Family History  Problem Relation Age of Onset   Heart disease Mother    Stroke Father    Diabetes Sister    Parkinson's disease Sister    Social History   Tobacco Use   Smoking status: Every Day    Packs/day: 2.00    Years: 57.00    Additional pack years: 0.00    Total pack years: 114.00    Types: Cigarettes   Smokeless tobacco: Never   Tobacco comments:    0.5  PPD- 03/17/2023- khj  Substance Use Topics   Alcohol use: Yes    Comment: occas   Allergies  Allergen Reactions   Lisinopril Other (See Comments)    cough   Current Meds  Medication Sig   amLODipine (NORVASC) 10 MG tablet Take 1 tablet by mouth daily.   aspirin EC 81 MG tablet Take 81 mg by mouth daily.   ibuprofen (ADVIL,MOTRIN) 200 MG tablet Take 400 mg by mouth daily as needed for headache or moderate pain.   Lancets 28G MISC Use 1 each once daily Use as instructed.   levothyroxine (SYNTHROID, LEVOTHROID) 75 MCG tablet Take 75 mcg by mouth daily before breakfast.   metFORMIN (GLUCOPHAGE) 500 MG tablet Take 1 tablet by mouth daily with breakfast.   omeprazole (PRILOSEC) 20 MG capsule Take 20 mg by mouth daily.   pravastatin (PRAVACHOL) 20 MG tablet Take 20 mg by mouth daily.   valsartan (DIOVAN) 80 MG tablet Take 80 mg by mouth daily.    There is no immunization history on file for this patient.     Objective:   Physical Exam BP 138/78 (BP Location: Left Arm, Cuff Size: Normal)   Pulse 77   Temp (!) 97.1 F (36.2 C)   Ht 5' 4.5" (1.638 m)   Wt 135 lb 6.4 oz (61.4 kg)   SpO2 97%   BMI 22.88 kg/m   SpO2: 97 % O2 Device: None (Room air)  GENERAL: Well-developed, well-nourished woman, no acute distress, spry, fully ambulatory. HEAD: Normocephalic, atraumatic.  EYES: Pupils equal, round, reactive to light.  No scleral icterus.  MOUTH: From lower dentures.  Oral mucosa moist.  No thrush. NECK: Supple. No thyromegaly. Trachea midline. No JVD.  No adenopathy. PULMONARY: Good air entry bilaterally.  Coarse, otherwise no adventitious sounds. CARDIOVASCULAR: S1 and S2. Regular rate and rhythm.  ABDOMEN: Benign. MUSCULOSKELETAL: No joint deformity, wears acrylic nails, trace to +1 edema at the level of the ankles.  NEUROLOGIC: No overt focal deficit, no gait disturbance, speech is fluent. SKIN: Intact,warm,dry. PSYCH: Mood and behavior normal.   Representative images from  CT chest (lung cancer screening) performed 26 January 2023 showing most salient nodules that have not changed in character.  Patient also has emphysema of moderate degree and the appearance of groundglass attenuation of the lungs as well:        Assessment & Plan:     ICD-10-CM   1. Pulmonary emphysema, unspecified emphysema type  J43.9 Pulmonary Function Test [facility] Only   Will obtain PFTs to better characterize Encouraged to quit smoking    2. Lung nodules  R91.8 Pulmonary Function Test [facility] Only    CT CHEST WO CONTRAST   Follow-up with CT chest 6 months    3. Ground glass opacity present on imaging of lung - attenuation  R91.8    Likely smoking-related lung disease Respiratory bronchiolitis possible Less likely,  Langerhans    4. Tobacco dependence due to cigarettes  F17.210    Patient was counseled regards to discontinuation of smoking Total counseling time 3 to 5 minutes     Orders Placed This Encounter  Procedures   CT CHEST WO CONTRAST    6 months from last CT on 01/26/2023    Standing Status:   Future    Standing Expiration Date:   03/16/2024    Order Specific Question:   Preferred imaging location?    Answer:   [facility]   Pulmonary Function Test [facility] Only    Standing Status:   Future    Standing Expiration Date:   03/16/2024    Order Specific Question:   Full PFT: includes the following: basic spirometry, spirometry pre & post bronchodilator, diffusion capacity (DLCO), lung volumes    Answer:   Full PFT    Order Specific Question:   This test can only be performed at    Answer:   [facility]   Will see the patient in follow-up in 3 months time she is to contact us prior to that time should any new problems arise.  Renold Don, MD Advanced Bronchoscopy PCCM [facility]    *This note was dictated using voice recognition software/Dragon.  Despite best efforts to proofread, errors can occur which can change the meaning. Any  transcriptional errors that result from this process are unintentional and may not be fully corrected at the time of dictation.

## 2023-06-08 ENCOUNTER — Telehealth: Payer: Self-pay | Admitting: Pulmonary Disease

## 2023-06-08 NOTE — Telephone Encounter (Signed)
When I called this patient to schedule her PFT and she called back I didn't have any PFT appts available before her appt on 06/27/23. We have her PFT scheduled on 06/30/23 @ 1:00pm Can you try and reschedule her appt with Dr. Jayme Cloud until after the PFT appt

## 2023-06-08 NOTE — Telephone Encounter (Signed)
Spoke to patient and reschedule appt to 07/05/2023 at 9:00. Nothing further needed.

## 2023-06-27 ENCOUNTER — Ambulatory Visit: Payer: 59 | Admitting: Pulmonary Disease

## 2023-06-30 ENCOUNTER — Ambulatory Visit: Payer: 59

## 2023-07-01 ENCOUNTER — Ambulatory Visit: Payer: 59

## 2023-07-04 ENCOUNTER — Ambulatory Visit: Payer: 59 | Attending: Pulmonary Disease

## 2023-07-04 DIAGNOSIS — R059 Cough, unspecified: Secondary | ICD-10-CM | POA: Insufficient documentation

## 2023-07-04 DIAGNOSIS — R0609 Other forms of dyspnea: Secondary | ICD-10-CM | POA: Insufficient documentation

## 2023-07-04 DIAGNOSIS — R911 Solitary pulmonary nodule: Secondary | ICD-10-CM | POA: Diagnosis not present

## 2023-07-04 DIAGNOSIS — R918 Other nonspecific abnormal finding of lung field: Secondary | ICD-10-CM | POA: Insufficient documentation

## 2023-07-04 DIAGNOSIS — J439 Emphysema, unspecified: Secondary | ICD-10-CM | POA: Diagnosis not present

## 2023-07-04 DIAGNOSIS — J449 Chronic obstructive pulmonary disease, unspecified: Secondary | ICD-10-CM | POA: Insufficient documentation

## 2023-07-04 DIAGNOSIS — F17290 Nicotine dependence, other tobacco product, uncomplicated: Secondary | ICD-10-CM | POA: Diagnosis not present

## 2023-07-04 DIAGNOSIS — R942 Abnormal results of pulmonary function studies: Secondary | ICD-10-CM | POA: Diagnosis not present

## 2023-07-04 MED ORDER — ALBUTEROL SULFATE (2.5 MG/3ML) 0.083% IN NEBU
2.5000 mg | INHALATION_SOLUTION | Freq: Once | RESPIRATORY_TRACT | Status: AC
  Administered 2023-07-04: 2.5 mg via RESPIRATORY_TRACT
  Filled 2023-07-04: qty 3

## 2023-07-05 ENCOUNTER — Encounter: Payer: Self-pay | Admitting: Pulmonary Disease

## 2023-07-05 ENCOUNTER — Ambulatory Visit (INDEPENDENT_AMBULATORY_CARE_PROVIDER_SITE_OTHER): Payer: 59 | Admitting: Pulmonary Disease

## 2023-07-05 VITALS — BP 130/70 | HR 82 | Temp 97.6°F | Ht 64.5 in | Wt 134.0 lb

## 2023-07-05 DIAGNOSIS — F17210 Nicotine dependence, cigarettes, uncomplicated: Secondary | ICD-10-CM

## 2023-07-05 DIAGNOSIS — J849 Interstitial pulmonary disease, unspecified: Secondary | ICD-10-CM | POA: Diagnosis not present

## 2023-07-05 DIAGNOSIS — R918 Other nonspecific abnormal finding of lung field: Secondary | ICD-10-CM

## 2023-07-05 DIAGNOSIS — J439 Emphysema, unspecified: Secondary | ICD-10-CM

## 2023-07-05 NOTE — Patient Instructions (Signed)
Your walk test was very good today.  We will see you around the first week in September to go over the results of your scan.  Your follow-up scan is scheduled for 14 August.  Call prior to your follow-up visit should you have any new difficulties.

## 2023-07-05 NOTE — Progress Notes (Signed)
Subjective:    Patient ID: Patricia Nguyen, female    DOB: 1941-08-15, 82 y.o.   MRN: [id number]  Patient Care Team: Lynnea Ferrier, MD as PCP - General (Internal Medicine)  Chief Complaint  Patient presents with   Follow-up    No SOB, wheezing, or cough.     HPI Patient is an 82 year old current smoker with 114-pack-year history of smoking and a medical history as noted below, presents for follow-up of lung nodules noted on CT chest lung cancer screening and COPD (emphysema).  She previously followed with regards to lung cancer screening at [facility].  She has had chronic findings of bilateral lung nodules more prominent on the right and diffuse groundglass attenuation of the lungs as well as emphysema.  First mention of these findings was in October 2016 by report from [facility] radiology.  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  Review of her prior imaging available has shown that there is little change on these findings throughout the years.  The patient is totally asymptomatic with regards to these findings. She does not endorse dyspnea, wheezing or chest pain.  No chest tightness.  She has cough productive of clear sputum particularly in the mornings but this does not last throughout the rest of the day.  No hemoptysis.  She has had no weight loss or anorexia.  No gastroesophageal reflux symptoms.  No other symptomatology.   She had pulmonary function testing performed yesterday.  These do not show significant change from the available spirometry and diffusion capacity numbers performed at [facility] in 2017.  The patient remains asymptomatic.  States she does not need any inhalers.  She has cut down her smoking to approximately 1/4 pack of cigarettes per day.  DATA 06/16/2016 PFTs [facility]): FEV1 1.87 L, FVC 2.26 L, FEV1/FVC 82%, diffusion capacity severely used. 07/04/2023 PFTs: FEV1 1.71 L or 80% predicted, FVC 2.16 L or 70% predicted,  FEV1/FVC 79%, lung volumes moderately reduced, diffusion capacity moderately reduced.  Review of Systems A 10 point review of systems was performed and it is as noted above otherwise negative.   Patient Active Problem List   Diagnosis Date Noted   Atherosclerosis of native arteries of extremity with intermittent claudication (HCC) 07/07/2021   Essential hypertension 10/19/2019   Hyperlipidemia 10/19/2019   AVM (arteriovenous malformation) 10/11/2019   Carotid stenosis, asymptomatic, bilateral 10/11/2019   Pulmonary nodules 02/23/2018   Pre-diabetes 06/22/2017   Encounter for screening for lung cancer 02/16/2017   Osteopenia of femoral neck 10/08/2016   Tobacco use disorder 09/07/2016   Pulmonary emphysema (HCC) 06/16/2016   Positive FIT (fecal immunochemical test) 04/05/2016   Type 2 diabetes mellitus (HCC) 01/05/2016   GERD (gastroesophageal reflux disease) 05/04/2013   Sensorineural hearing loss 01/05/2013   Hypothyroidism 03/16/2010    Social History   Tobacco Use   Smoking status: Every Day    Current packs/day: 2.00    Average packs/day: 2.0 packs/day for 57.0 years (114.0 ttl pk-yrs)    Types: Cigarettes   Smokeless tobacco: Never   Tobacco comments:    0.25 PPD- 07/05/2023 khj  Substance Use Topics   Alcohol use: Yes    Comment: occas    Allergies  Allergen Reactions   Lisinopril Other (See Comments)    cough    Current Meds  Medication Sig   amLODipine (NORVASC) 10 MG tablet Take 1 tablet by mouth daily.   aspirin EC 81 MG tablet Take  81 mg by mouth daily.   hydrochlorothiazide (HYDRODIURIL) 25 MG tablet Take 25 mg by mouth daily.   ibuprofen (ADVIL,MOTRIN) 200 MG tablet Take 400 mg by mouth daily as needed for headache or moderate pain.   Lancets 28G MISC Use 1 each once daily Use as instructed.   levothyroxine (SYNTHROID) 88 MCG tablet Take 88 mcg by mouth daily.   metFORMIN (GLUCOPHAGE) 500 MG tablet Take 1 tablet by mouth daily with breakfast.    omeprazole (PRILOSEC) 20 MG capsule Take 20 mg by mouth daily.   pravastatin (PRAVACHOL) 20 MG tablet Take 20 mg by mouth daily.   valsartan (DIOVAN) 80 MG tablet Take 80 mg by mouth daily.     There is no immunization history on file for this patient.      Objective:     BP 130/70 (BP Location: Left Arm, Cuff Size: Normal)   Pulse 82   Temp 97.6 F (36.4 C)   Ht 5' 4.5" (1.638 m)   Wt 134 lb (60.8 kg)   SpO2 100%   BMI 22.65 kg/m   SpO2: 100 % O2 Device: None (Room air)  GENERAL: Well-developed, well-nourished woman, no acute distress, spry, fully ambulatory. HEAD: Normocephalic, atraumatic.  EYES: Pupils equal, round, reactive to light.  No scleral icterus.  MOUTH: From lower dentures.  Oral mucosa moist.  No thrush. NECK: Supple. No thyromegaly. Trachea midline. No JVD.  No adenopathy. PULMONARY: Good air entry bilaterally.  Coarse, otherwise no adventitious sounds. CARDIOVASCULAR: S1 and S2. Regular rate and rhythm.  ABDOMEN: Benign. MUSCULOSKELETAL: No joint deformity, wears acrylic nails, trace to +1 edema at the level of the ankles.  NEUROLOGIC: No overt focal deficit, no gait disturbance, speech is fluent. SKIN: Intact,warm,dry. PSYCH: Mood and behavior normal.    Ambulatory oximetry was performed today given decrease in DLCO on PFTs: At rest on room air oxygen saturation was 100%, the patient ambulated at a moderate pace, completed 3 laps, O2 nadir 99%, no shortness of breath.  Resting heart rate was 81 bpm at maximum for this exercise 105 bpm.   Assessment & Plan:     ICD-10-CM   1. ILD (interstitial lung disease) (HCC)  J84.9    Combined restrictive/obstructive physiology Groundglass attenuation, nodules and cysts Query Langerhans' cell histiocytosis versus DIP Continue to monitor    2. Lung nodules  R91.8    Appears stable from prior Has follow-up CT chest 14 August    3. Tobacco dependence due to cigarettes  F17.210    Counseled regards  discontinuation of smoking Counseling time 3 to 5 minutes     The patient had no desaturations noted during ambulatory oximetry.  Will see the patient in follow-up after CT scan on 14 August is performed.  He is to contact us prior to that time should any new difficulties arise.   Gailen Shelter, MD Advanced Bronchoscopy PCCM [facility]    *This note was dictated using voice recognition software/Dragon.  Despite best efforts to proofread, errors can occur which can change the meaning. Any transcriptional errors that result from this process are unintentional and may not be fully corrected at the time of dictation.

## 2023-07-06 LAB — PULMONARY FUNCTION TEST ARMC ONLY: RV % pred: 71 %

## 2023-07-07 ENCOUNTER — Encounter: Payer: Self-pay | Admitting: Pulmonary Disease

## 2023-07-07 ENCOUNTER — Other Ambulatory Visit (INDEPENDENT_AMBULATORY_CARE_PROVIDER_SITE_OTHER): Payer: Self-pay | Admitting: Nurse Practitioner

## 2023-07-07 DIAGNOSIS — I6523 Occlusion and stenosis of bilateral carotid arteries: Secondary | ICD-10-CM

## 2023-07-07 DIAGNOSIS — I70213 Atherosclerosis of native arteries of extremities with intermittent claudication, bilateral legs: Secondary | ICD-10-CM

## 2023-07-08 ENCOUNTER — Encounter (INDEPENDENT_AMBULATORY_CARE_PROVIDER_SITE_OTHER): Payer: 59

## 2023-07-08 ENCOUNTER — Ambulatory Visit (INDEPENDENT_AMBULATORY_CARE_PROVIDER_SITE_OTHER): Payer: Medicare Other | Admitting: Nurse Practitioner

## 2023-07-08 ENCOUNTER — Encounter (INDEPENDENT_AMBULATORY_CARE_PROVIDER_SITE_OTHER): Payer: Self-pay

## 2023-07-11 ENCOUNTER — Telehealth (INDEPENDENT_AMBULATORY_CARE_PROVIDER_SITE_OTHER): Payer: Self-pay

## 2023-07-11 NOTE — Telephone Encounter (Signed)
Patient called about her appt. She came Friday @ 1  when clinic was canceled. She wants to schedule a new appt.   Please advise

## 2023-07-27 ENCOUNTER — Ambulatory Visit
Admission: RE | Admit: 2023-07-27 | Discharge: 2023-07-27 | Disposition: A | Discharge status: Home / Self Care | Payer: 59 | Source: Ambulatory Visit | Attending: Pulmonary Disease | Admitting: Pulmonary Disease

## 2023-07-27 DIAGNOSIS — R918 Other nonspecific abnormal finding of lung field: Secondary | ICD-10-CM | POA: Insufficient documentation

## 2023-08-03 ENCOUNTER — Other Ambulatory Visit: Payer: Self-pay

## 2023-08-03 DIAGNOSIS — R918 Other nonspecific abnormal finding of lung field: Secondary | ICD-10-CM

## 2023-08-09 ENCOUNTER — Ambulatory Visit: Payer: 59 | Admitting: Acute Care

## 2023-08-10 ENCOUNTER — Ambulatory Visit (INDEPENDENT_AMBULATORY_CARE_PROVIDER_SITE_OTHER): Payer: 59

## 2023-08-10 ENCOUNTER — Encounter (INDEPENDENT_AMBULATORY_CARE_PROVIDER_SITE_OTHER): Payer: Self-pay | Admitting: Nurse Practitioner

## 2023-08-10 ENCOUNTER — Ambulatory Visit (INDEPENDENT_AMBULATORY_CARE_PROVIDER_SITE_OTHER): Payer: 59 | Admitting: Nurse Practitioner

## 2023-08-10 VITALS — BP 156/72 | HR 69 | Resp 15 | Wt 135.0 lb

## 2023-08-10 DIAGNOSIS — I6523 Occlusion and stenosis of bilateral carotid arteries: Secondary | ICD-10-CM

## 2023-08-10 DIAGNOSIS — E119 Type 2 diabetes mellitus without complications: Secondary | ICD-10-CM | POA: Diagnosis not present

## 2023-08-10 DIAGNOSIS — I70213 Atherosclerosis of native arteries of extremities with intermittent claudication, bilateral legs: Secondary | ICD-10-CM | POA: Diagnosis not present

## 2023-08-10 DIAGNOSIS — E785 Hyperlipidemia, unspecified: Secondary | ICD-10-CM

## 2023-08-10 NOTE — Progress Notes (Unsigned)
Subjective:    Patient ID: Patricia Nguyen, female    DOB: Jun 14, 1941, 82 y.o.   MRN: [id number] Chief Complaint  Patient presents with  . Follow-up    Ultrasound follow up    Patient returns today in follow up of multiple vascular issues.  She says she is doing well today without any complaints.  She denies any focal neurologic symptoms.  Carotid duplex today shows 1 to 39% ICA stenosis bilaterally which is slightly less on the left than previous. She continues to have claudication-like symptoms but they are improving after having physical therapy.  She denies any open wounds or ulcerations.  She denies any TIA or amaurosis fugax-like symptoms.  The patient's ABI today notes an ABI 0.2 on the right and 0.87 on the left.  Previously it was 0.81 on the right and 0.85 on the left.  Overall not a significant change.  Primarily monophasic tibial artery waveforms bilaterally.    Review of Systems  Cardiovascular:  Negative for leg swelling.  Musculoskeletal:  Positive for arthralgias.  All other systems reviewed and are negative.      Objective:   Physical Exam Vitals reviewed.  HENT:     Head: Normocephalic.  Cardiovascular:     Rate and Rhythm: Normal rate.     Pulses:          Radial pulses are 2+ on the right side and 2+ on the left side.  Pulmonary:     Effort: Pulmonary effort is normal.  Skin:    General: Skin is warm and dry.  Neurological:     Mental Status: She is alert and oriented to person, place, and time.  Psychiatric:        Mood and Affect: Mood normal.        Behavior: Behavior normal.        Thought Content: Thought content normal.        Judgment: Judgment normal.    BP (!) 156/72 (BP Location: Right Arm)   Pulse 69   Resp 15   Wt 135 lb (61.2 kg)   BMI 22.81 kg/m   Past Medical History:  Diagnosis Date  . Edema    FEET/LEGS  . GERD (gastroesophageal reflux disease)   . HOH (hard of hearing)   . Hypertension   . Hypothyroidism   . Thyroid  disease     Social History   Socioeconomic History  . Marital status: Divorced    Spouse name: Not on file  . Number of children: Not on file  . Years of education: Not on file  . Highest education level: Not on file  Occupational History  . Not on file  Tobacco Use  . Smoking status: Every Day    Current packs/day: 2.00    Average packs/day: 2.0 packs/day for 57.0 years (114.0 ttl pk-yrs)    Types: Cigarettes  . Smokeless tobacco: Never  . Tobacco comments:    0.25 PPD- 07/05/2023 khj  Vaping Use  . Vaping status: Never Used  Substance and Sexual Activity  . Alcohol use: Yes    Comment: occas  . Drug use: No  . Sexual activity: Not on file  Other Topics Concern  . Not on file  Social History Narrative  . Not on file   Social Determinants of Health   Financial Resource Strain: Low Risk  (02/16/2023)   Received from [facility], [facility]   Overall Financial Resource Strain (CARDIA)   .  Difficulty of Paying Living Expenses: Not hard at all  Food Insecurity: No Food Insecurity (02/16/2023)   Received from [facility], [facility]   Hunger Vital Sign   . Worried About Programme researcher, broadcasting/film/video in the Last Year: Never true   . Ran Out of Food in the Last Year: Never true  Transportation Needs: No Transportation Needs (02/16/2023)   Received from [facility], [facility]   [facility] - Transportation   . In the past 12 months, has lack of transportation kept you from medical appointments or from getting medications?: No   . Lack of Transportation (Non-Medical): No  Physical Activity: Not on file  Stress: Not on file  Social Connections: Not on file  Intimate Partner Violence: Not on file    Past Surgical History:  Procedure Laterality Date  . ABDOMINAL HYSTERECTOMY    . BACK SURGERY    . [place]     hearing implant  . CATARACT EXTRACTION W/PHACO Left 04/13/2016    Procedure: CATARACT EXTRACTION PHACO AND INTRAOCULAR LENS PLACEMENT (IOC);  Surgeon: Galen Manila, MD;  Location: [facility] ORS;  Service: Ophthalmology;  Laterality: Left;  [place] 53.6 AP% 19.3 CDE 10.32 Fluid Pack Lot # [id number]  . CATARACT EXTRACTION W/PHACO Right 10/31/2018   Procedure: CATARACT EXTRACTION PHACO AND INTRAOCULAR LENS PLACEMENT (IOC);  Surgeon: Galen Manila, MD;  Location: [facility] ORS;  Service: Ophthalmology;  Laterality: Right;  [place] 00:37 CDE 5.23 Fluid pack lot # [id number]    Family History  Problem Relation Age of Onset  . Heart disease Mother   . Stroke Father   . Diabetes Sister   . Parkinson's disease Sister     Allergies  Allergen Reactions  . Lisinopril Other (See Comments)    cough       Latest Ref Rng & Units 09/29/2019   11:08 PM  CBC  WBC 4.0 - 10.5 K/uL 11.2   Hemoglobin 12.0 - 15.0 g/dL 28.4   Hematocrit 13.2 - 46.0 % 45.7   Platelets 150 - 400 K/uL 186       CMP     Component Value Date/Time   NA 138 09/29/2019 2308   K 3.3 (L) 09/29/2019 2308   CL 100 09/29/2019 2308   CO2 26 09/29/2019 2308   GLUCOSE 122 (H) 09/29/2019 2308   BUN 10 09/29/2019 2308   CREATININE 0.95 09/29/2019 2308   CALCIUM 10.3 09/29/2019 2308   GFRNONAA 58 (L) 09/29/2019 2308     No results found.     Assessment & Plan:   1. Atherosclerosis of native artery of both lower extremities with intermittent claudication (HCC)  Recommend:  The patient has evidence of atherosclerosis of the lower extremities with no significant claudication.  The patient does not voice lifestyle limiting changes at this point in time.  Noninvasive studies do not suggest clinically significant change.  No invasive studies, angiography or surgery at this time The patient should continue walking and begin a more formal exercise program.  The patient should continue antiplatelet therapy and aggressive treatment of the lipid abnormalities  No changes in the patient's medications  at this time  Continued surveillance is indicated as atherosclerosis is likely to progress with time.    The patient will continue follow up with noninvasive studies as ordered.   2. Carotid stenosis, asymptomatic, bilateral Recommend:  Given the patient's asymptomatic subcritical stenosis no further invasive testing or surgery at this time.  Duplex ultrasound shows <  40% stenosis bilaterally.  Continue antiplatelet therapy as prescribed Continue management of CAD, HTN and Hyperlipidemia Healthy heart diet,  encouraged exercise at least 4 times per week Follow up in 12 months with duplex ultrasound and physical exam   3. Type 2 diabetes mellitus without complication, without long-term current use of insulin (HCC) Continue hypoglycemic medications as already ordered, these medications have been reviewed and there are no changes at this time.  Hgb A1C to be monitored as already arranged by primary service  4. Hyperlipidemia, unspecified hyperlipidemia type Continue statin as ordered and reviewed, no changes at this time   Current Outpatient Medications on File Prior to Visit  Medication Sig Dispense Refill  . amLODipine (NORVASC) 10 MG tablet Take 1 tablet by mouth daily.    Marland Kitchen aspirin EC 81 MG tablet Take 81 mg by mouth daily.    . hydrochlorothiazide (HYDRODIURIL) 25 MG tablet Take 25 mg by mouth daily.    Marland Kitchen ibuprofen (ADVIL,MOTRIN) 200 MG tablet Take 400 mg by mouth daily as needed for headache or moderate pain.    . Lancets 28G MISC Use 1 each once daily Use as instructed.    Marland Kitchen levothyroxine (SYNTHROID) 88 MCG tablet Take 88 mcg by mouth daily.    . metFORMIN (GLUCOPHAGE) 500 MG tablet Take 1 tablet by mouth daily with breakfast.    . omeprazole (PRILOSEC) 20 MG capsule Take 20 mg by mouth daily.    . pravastatin (PRAVACHOL) 20 MG tablet Take 20 mg by mouth daily.    . valsartan (DIOVAN) 80 MG tablet Take 80 mg by mouth daily.    . Cholecalciferol (D3-50 PO) Take 50 mcg by  mouth daily. (Patient not taking: Reported on 07/05/2023)     No current facility-administered medications on file prior to visit.    There are no Patient Instructions on file for this visit. No follow-ups on file.   Georgiana Spinner, NP

## 2023-08-16 ENCOUNTER — Ambulatory Visit: Payer: 59 | Admitting: Pulmonary Disease

## 2023-08-16 LAB — VAS US ABI WITH/WO TBI
Left ABI: 0.91
Right ABI: 0.89

## 2024-02-01 ENCOUNTER — Ambulatory Visit: Payer: 59

## 2024-02-09 ENCOUNTER — Ambulatory Visit
Admission: RE | Admit: 2024-02-09 | Discharge: 2024-02-09 | Disposition: A | Discharge status: Home / Self Care | Payer: 59 | Source: Ambulatory Visit | Attending: Pulmonary Disease | Admitting: Pulmonary Disease

## 2024-02-09 DIAGNOSIS — R918 Other nonspecific abnormal finding of lung field: Secondary | ICD-10-CM | POA: Insufficient documentation

## 2024-03-06 ENCOUNTER — Telehealth: Payer: Self-pay | Admitting: Pulmonary Disease

## 2024-03-06 NOTE — Telephone Encounter (Signed)
I have notified the patient of her CT results.  Nothing further needed.

## 2024-03-06 NOTE — Telephone Encounter (Signed)
 Patient is returning missed call in regards to her chest xray results.

## 2024-03-13 ENCOUNTER — Ambulatory Visit: Admitting: Pulmonary Disease

## 2024-03-14 ENCOUNTER — Encounter: Payer: Self-pay | Admitting: Pulmonary Disease

## 2024-03-14 ENCOUNTER — Ambulatory Visit: Admitting: Pulmonary Disease

## 2024-03-14 VITALS — BP 128/66 | HR 72 | Temp 97.7°F | Ht 64.5 in | Wt 137.6 lb

## 2024-03-14 DIAGNOSIS — F17210 Nicotine dependence, cigarettes, uncomplicated: Secondary | ICD-10-CM | POA: Diagnosis not present

## 2024-03-14 DIAGNOSIS — J439 Emphysema, unspecified: Secondary | ICD-10-CM | POA: Diagnosis not present

## 2024-03-14 DIAGNOSIS — J849 Interstitial pulmonary disease, unspecified: Secondary | ICD-10-CM | POA: Diagnosis not present

## 2024-03-14 DIAGNOSIS — R918 Other nonspecific abnormal finding of lung field: Secondary | ICD-10-CM

## 2024-03-14 NOTE — Patient Instructions (Signed)
 VISIT SUMMARY:  Today, you had a follow-up appointment to review your pulmonary nodules and scarring. Recent imaging showed no changes, and you are not experiencing any shortness of breath. We also discussed your smoking habits and your efforts to reduce smoking.  YOUR PLAN:  -PULMONARY NODULES WITH SCARRING: Pulmonary nodules are small growths in the lungs, and scarring can occur from various causes. Your recent CT scan shows that these nodules and scarring have not progressed. You are not experiencing any symptoms like shortness of breath. We will schedule a follow-up CT scan for February 2026. Please report any new symptoms such as cough or shortness of breath.  -TOBACCO USE DISORDER: Tobacco use disorder is a condition where a person is dependent on smoking. You have reduced your smoking to one pack every two and a half days and are trying to cut back further by engaging in activities like games and puzzles. Continue your efforts to reduce smoking, and we support your cessation efforts.  INSTRUCTIONS:  Please schedule a follow-up CT scan for February 2026. Report any new symptoms such as cough or shortness of breath to [place] immediately.

## 2024-03-14 NOTE — Progress Notes (Signed)
 Subjective:    Patient ID: Patricia Nguyen, female    DOB: 04/02/1941, 83 y.o.   MRN: [id number]  Patient Care Team: Lynnea Ferrier, MD as PCP - General (Internal Medicine)  Chief Complaint  Patient presents with   Follow-up    BACKGROUND/INTERVAL:Patient is an 83 year old current smoker with 114-pack-year history of smoking and a medical history as noted below, presents for follow-up of lung nodules noted on CT chest lung cancer screening and COPD (emphysema).  She was last seen on 05 July 2023.  She had a follow-up CT chest on 09 February 2024.  Presents for follow-up on these issues and to discuss CT chest.  HPI Discussed the use of AI scribe software for clinical note transcription with the patient, who gave verbal consent to proceed.  History of Present Illness   Patricia Nguyen is an 83 year old female who presents with follow-up of pulmonary nodules and interstitial lung disease.  She is here for follow-up regarding her pulmonary nodules and tissue lung disease. Recent imaging showed no changes compared to previous scans. No shortness of breath is reported.  Classified as a lung RADS 2.  She is not currently using any inhalers and does not have an emergency inhaler. She recalls being prescribed an inhaler years ago when she had the flu and experienced difficulty breathing, but she has not used one since.  She is not interested on having any inhalers prescribed.  She is totally asymptomatic with regards to her COPD.  She continues to smoke, although she has reduced her consumption to one pack lasting two and a half days. She attributes her smoking habit to needing something to do with her hands, especially while on the computer. She has been trying to cut back by engaging in activities like doing puzzles.   DATA 06/16/2016 PFTs [facility]): FEV1 1.87 L, FVC 2.26 L, FEV1/FVC 82%, diffusion capacity severely used. 07/04/2023 PFTs: FEV1 1.71 L or 80% predicted, FVC 2.16 L or 70% predicted,  FEV1/FVC 79%, lung volumes moderately reduced, diffusion capacity moderately reduced.    Review of Systems A 10 point review of systems was performed and it is as noted above otherwise negative.   Patient Active Problem List   Diagnosis Date Noted   Atherosclerosis of native arteries of extremity with intermittent claudication (HCC) 07/07/2021   Essential hypertension 10/19/2019   Hyperlipidemia 10/19/2019   AVM (arteriovenous malformation) 10/11/2019   Carotid stenosis, asymptomatic, bilateral 10/11/2019   Pulmonary nodules 02/23/2018   Pre-diabetes 06/22/2017   Encounter for screening for lung cancer 02/16/2017   Osteopenia of femoral neck 10/08/2016   Tobacco use disorder 09/07/2016   Pulmonary emphysema (HCC) 06/16/2016   Positive FIT (fecal immunochemical test) 04/05/2016   Type 2 diabetes mellitus (HCC) 01/05/2016   GERD (gastroesophageal reflux disease) 05/04/2013   Sensorineural hearing loss 01/05/2013   Hypothyroidism 03/16/2010    Social History   Tobacco Use   Smoking status: Every Day    Current packs/day: 0.50    Average packs/day: 2.0 packs/day for 65.2 years (130.0 ttl pk-yrs)    Types: Cigarettes    Start date: 1960   Smokeless tobacco: Never   Tobacco comments:    5-6 cigarettes a day 03/14/2024  Substance Use Topics   Alcohol use: Yes    Comment: occas    Allergies  Allergen Reactions   Lisinopril Other (See Comments)    cough    Current Meds  Medication Sig   aspirin EC 81 MG  tablet Take 81 mg by mouth daily.   Cholecalciferol (D3-50 PO) Take 50 mcg by mouth daily.   hydrochlorothiazide (HYDRODIURIL) 25 MG tablet Take 25 mg by mouth daily.   ibuprofen (ADVIL,MOTRIN) 200 MG tablet Take 400 mg by mouth daily as needed for headache or moderate pain.   Lancets 28G MISC Use 1 each once daily Use as instructed.   levothyroxine (SYNTHROID) 88 MCG tablet Take 88 mcg by mouth daily.   metFORMIN (GLUCOPHAGE) 500 MG tablet Take 1 tablet by mouth daily  with breakfast.   omeprazole (PRILOSEC) 20 MG capsule Take 20 mg by mouth daily.   pravastatin (PRAVACHOL) 20 MG tablet Take 20 mg by mouth daily.   valsartan (DIOVAN) 80 MG tablet Take 80 mg by mouth daily.     There is no immunization history on file for this patient.      Objective:     BP 128/66 (BP Location: Left Arm, Patient Position: Sitting, Cuff Size: Normal)   Pulse 72   Temp 97.7 F (36.5 C) (Temporal)   Ht 5' 4.5" (1.638 m)   Wt 137 lb 9.6 oz (62.4 kg)   SpO2 100%   BMI 23.25 kg/m   SpO2: 100 %  GENERAL: Well-developed, well-nourished woman, no acute distress, spry, fully ambulatory.  No conversational dyspnea. HEAD: Normocephalic, atraumatic.  EYES: Pupils equal, round, reactive to light.  No scleral icterus.  MOUTH: Upper and  lower dentures.  Oral mucosa moist.  No thrush. NECK: Supple. No thyromegaly. Trachea midline. No JVD.  No adenopathy. PULMONARY: Good air entry bilaterally.  Coarse, otherwise no adventitious sounds. CARDIOVASCULAR: S1 and S2. Regular rate and rhythm.  ABDOMEN: Benign. MUSCULOSKELETAL: No joint deformity, wears acrylic nails, no edema noted.  NEUROLOGIC: No overt focal deficit, no gait disturbance, speech is fluent. SKIN: Intact,warm,dry. PSYCH: Mood and behavior normal.       Assessment & Plan:     ICD-10-CM   1. Pulmonary emphysema, unspecified emphysema type (HCC)  J43.9 CT CHEST WO CONTRAST    2. ILD (interstitial lung disease) (HCC)  J84.9 CT CHEST WO CONTRAST    3. Lung nodules  R91.8 CT CHEST WO CONTRAST    4. Tobacco dependence due to cigarettes  F17.210       Orders Placed This Encounter  Procedures   CT CHEST WO CONTRAST    One year CT    Standing Status:   Future    Expected Date:   02/11/2025    Expiration Date:   03/14/2025    Preferred imaging location?:   [facility]   Discussion:    Pulmonary nodules with scarring CT imaging indicates well-managed pulmonary nodules and scarring with no  progression. She is asymptomatic, reporting no dyspnea. - Schedule follow-up CT scan for February 2026 - Advise her to report any new symptoms such as cough or dyspnea  Tobacco use disorder She continues to smoke, currently at a reduced rate of one pack every two and a half days. She is actively attempting to reduce smoking, engaging in activities like games and puzzles to aid cessation. - Encourage continued reduction in smoking and support cessation efforts  Desyre Calma has asymptomatic COPD, mild interstitial lung prominence, and lung nodules. All of these issues have been stable. We will follow up with a CT in February of 2026 to reassess the patient after that.    Advised if symptoms do not improve or worsen, to please contact office for sooner follow up or seek emergency care.  I spent 30 minutes of dedicated to the care of this patient on the date of this encounter to include pre-visit review of records, face-to-face time with the patient discussing conditions above, post visit ordering of testing, clinical documentation with the electronic health record, making appropriate referrals as documented, and communicating necessary findings to members of the patients care team.   C. Danice Goltz, MD [facility]    *This note was generated using voice recognition software/Dragon and/or AI transcription program.  Despite best efforts to proofread, errors can occur which can change the meaning. Any transcriptional errors that result from this process are unintentional and may not be fully corrected at the time of dictation.

## 2024-08-02 ENCOUNTER — Other Ambulatory Visit (INDEPENDENT_AMBULATORY_CARE_PROVIDER_SITE_OTHER): Payer: Self-pay | Admitting: Nurse Practitioner

## 2024-08-02 DIAGNOSIS — I6523 Occlusion and stenosis of bilateral carotid arteries: Secondary | ICD-10-CM

## 2024-08-02 DIAGNOSIS — I70213 Atherosclerosis of native arteries of extremities with intermittent claudication, bilateral legs: Secondary | ICD-10-CM

## 2024-08-10 ENCOUNTER — Encounter (INDEPENDENT_AMBULATORY_CARE_PROVIDER_SITE_OTHER): Payer: 59

## 2024-08-10 ENCOUNTER — Ambulatory Visit (INDEPENDENT_AMBULATORY_CARE_PROVIDER_SITE_OTHER): Payer: 59 | Admitting: Vascular Surgery
